# Patient Record
Sex: Female | Born: 1935 | Race: White | Hispanic: No | Marital: Married | Smoking: Former smoker
Health system: Southern US, Community
[De-identification: ages and names within clinical notes are randomized; demographics above are authoritative.]

## PROBLEM LIST (undated history)

## (undated) DIAGNOSIS — M1990 Unspecified osteoarthritis, unspecified site: Secondary | ICD-10-CM

## (undated) DIAGNOSIS — K219 Gastro-esophageal reflux disease without esophagitis: Secondary | ICD-10-CM

## (undated) DIAGNOSIS — I10 Essential (primary) hypertension: Secondary | ICD-10-CM

## (undated) DIAGNOSIS — R51 Headache: Secondary | ICD-10-CM

## (undated) DIAGNOSIS — Z974 Presence of external hearing-aid: Secondary | ICD-10-CM

## (undated) DIAGNOSIS — C801 Malignant (primary) neoplasm, unspecified: Secondary | ICD-10-CM

## (undated) DIAGNOSIS — R112 Nausea with vomiting, unspecified: Secondary | ICD-10-CM

## (undated) DIAGNOSIS — IMO0001 Reserved for inherently not codable concepts without codable children: Secondary | ICD-10-CM

## (undated) DIAGNOSIS — Z9889 Other specified postprocedural states: Secondary | ICD-10-CM

## (undated) HISTORY — PX: ABDOMINAL HYSTERECTOMY: SHX81

## (undated) HISTORY — PX: BREAST EXCISIONAL BIOPSY: SUR124

## (undated) HISTORY — PX: TONSILLECTOMY: SUR1361

## (undated) HISTORY — PX: BREAST SURGERY: SHX581

## (undated) HISTORY — PX: BACK SURGERY: SHX140

## (undated) HISTORY — PX: APPENDECTOMY: SHX54

---

## 2000-07-15 ENCOUNTER — Encounter: Admission: RE | Admit: 2000-07-15 | Discharge: 2000-07-15 | Payer: Self-pay | Admitting: Neurological Surgery

## 2000-07-15 ENCOUNTER — Encounter: Payer: Self-pay | Admitting: Neurological Surgery

## 2003-01-09 ENCOUNTER — Ambulatory Visit (HOSPITAL_COMMUNITY): Admission: RE | Admit: 2003-01-09 | Discharge: 2003-01-09 | Payer: Self-pay | Admitting: Neurology

## 2003-03-08 ENCOUNTER — Encounter: Admission: RE | Admit: 2003-03-08 | Discharge: 2003-03-08 | Payer: Self-pay | Admitting: Neurology

## 2003-03-29 ENCOUNTER — Ambulatory Visit (HOSPITAL_COMMUNITY): Admission: RE | Admit: 2003-03-29 | Discharge: 2003-03-29 | Payer: Self-pay | Admitting: Neurology

## 2003-06-16 ENCOUNTER — Emergency Department (HOSPITAL_COMMUNITY): Admission: EM | Admit: 2003-06-16 | Discharge: 2003-06-16 | Payer: Self-pay | Admitting: Physical Therapy

## 2003-09-25 ENCOUNTER — Ambulatory Visit (HOSPITAL_COMMUNITY): Admission: RE | Admit: 2003-09-25 | Discharge: 2003-09-25 | Payer: Self-pay | Admitting: Neurological Surgery

## 2003-10-25 ENCOUNTER — Ambulatory Visit (HOSPITAL_COMMUNITY): Admission: RE | Admit: 2003-10-25 | Discharge: 2003-10-25 | Payer: Self-pay | Admitting: Neurological Surgery

## 2003-12-04 ENCOUNTER — Encounter: Admission: RE | Admit: 2003-12-04 | Discharge: 2003-12-04 | Payer: Self-pay | Admitting: Neurological Surgery

## 2004-07-02 ENCOUNTER — Emergency Department (HOSPITAL_COMMUNITY): Admission: EM | Admit: 2004-07-02 | Discharge: 2004-07-02 | Payer: Self-pay | Admitting: Emergency Medicine

## 2004-09-18 ENCOUNTER — Ambulatory Visit (HOSPITAL_COMMUNITY): Admission: RE | Admit: 2004-09-18 | Discharge: 2004-09-18 | Payer: Self-pay | Admitting: Neurological Surgery

## 2005-09-28 ENCOUNTER — Ambulatory Visit (HOSPITAL_COMMUNITY): Admission: RE | Admit: 2005-09-28 | Discharge: 2005-09-28 | Payer: Self-pay | Admitting: Neurological Surgery

## 2005-11-19 ENCOUNTER — Encounter: Admission: RE | Admit: 2005-11-19 | Discharge: 2005-11-19 | Payer: Self-pay | Admitting: Neurology

## 2007-06-27 ENCOUNTER — Ambulatory Visit (HOSPITAL_COMMUNITY): Admission: RE | Admit: 2007-06-27 | Discharge: 2007-06-27 | Payer: Self-pay | Admitting: Neurological Surgery

## 2007-09-19 ENCOUNTER — Ambulatory Visit (HOSPITAL_COMMUNITY): Admission: RE | Admit: 2007-09-19 | Discharge: 2007-09-19 | Payer: Self-pay | Admitting: Neurological Surgery

## 2008-01-25 ENCOUNTER — Ambulatory Visit: Payer: Self-pay | Admitting: Internal Medicine

## 2009-05-23 ENCOUNTER — Ambulatory Visit: Payer: Self-pay | Admitting: Internal Medicine

## 2010-02-28 ENCOUNTER — Other Ambulatory Visit (HOSPITAL_COMMUNITY): Payer: Self-pay | Admitting: Neurological Surgery

## 2010-02-28 DIAGNOSIS — M419 Scoliosis, unspecified: Secondary | ICD-10-CM

## 2010-03-01 ENCOUNTER — Encounter: Payer: Self-pay | Admitting: Neurology

## 2010-03-01 ENCOUNTER — Encounter: Payer: Self-pay | Admitting: Orthopedic Surgery

## 2010-03-02 ENCOUNTER — Encounter: Payer: Self-pay | Admitting: Interventional Radiology

## 2010-04-29 ENCOUNTER — Ambulatory Visit (HOSPITAL_COMMUNITY)
Admission: RE | Admit: 2010-04-29 | Discharge: 2010-04-29 | Disposition: A | Discharge status: Home / Self Care | Payer: Medicare Other | Source: Ambulatory Visit | Attending: Neurological Surgery | Admitting: Neurological Surgery

## 2010-04-29 DIAGNOSIS — M419 Scoliosis, unspecified: Secondary | ICD-10-CM

## 2010-04-29 DIAGNOSIS — M545 Low back pain, unspecified: Secondary | ICD-10-CM | POA: Insufficient documentation

## 2010-04-29 DIAGNOSIS — M4120 Other idiopathic scoliosis, site unspecified: Secondary | ICD-10-CM | POA: Insufficient documentation

## 2010-04-29 DIAGNOSIS — Q762 Congenital spondylolisthesis: Secondary | ICD-10-CM | POA: Insufficient documentation

## 2010-04-29 DIAGNOSIS — M48061 Spinal stenosis, lumbar region without neurogenic claudication: Secondary | ICD-10-CM | POA: Insufficient documentation

## 2010-04-29 MED ORDER — IOHEXOL 180 MG/ML  SOLN
20.0000 mL | Freq: Once | INTRAMUSCULAR | Status: AC | PRN
  Administered 2010-04-29: 20 mL via INTRATHECAL

## 2010-06-03 ENCOUNTER — Other Ambulatory Visit (HOSPITAL_COMMUNITY): Payer: Self-pay

## 2010-06-20 ENCOUNTER — Encounter (HOSPITAL_COMMUNITY)
Admission: RE | Admit: 2010-06-20 | Discharge: 2010-06-20 | Disposition: A | Discharge status: Home / Self Care | Payer: Medicare Other | Source: Ambulatory Visit | Attending: Neurological Surgery | Admitting: Neurological Surgery

## 2010-06-20 ENCOUNTER — Other Ambulatory Visit (HOSPITAL_COMMUNITY): Payer: Self-pay | Admitting: Neurological Surgery

## 2010-06-20 ENCOUNTER — Other Ambulatory Visit (HOSPITAL_COMMUNITY): Payer: Medicare Other

## 2010-06-20 DIAGNOSIS — M47817 Spondylosis without myelopathy or radiculopathy, lumbosacral region: Secondary | ICD-10-CM

## 2010-06-20 LAB — BASIC METABOLIC PANEL
Chloride: 104 mEq/L (ref 96–112)
Creatinine, Ser: 0.73 mg/dL (ref 0.4–1.2)
GFR calc non Af Amer: >60 mL/min (ref >60)
Glucose, Bld: 103 mg/dL — ABNORMAL HIGH (ref 70–99)
Potassium: 3.9 mEq/L (ref 3.5–5.1)
Sodium: 141 mEq/L (ref 135–145)

## 2010-06-20 LAB — ABO/RH: ABO/RH(D): A POS

## 2010-06-20 LAB — CBC
HCT: 39.8 % (ref 36.0–46.0)
Hemoglobin: 13.8 g/dL (ref 12.0–15.0)
MCH: 30.6 pg (ref 26.0–34.0)
MCV: 88.2 fL (ref 78.0–100.0)
Platelets: 164 10*3/uL (ref 150–400)
RBC: 4.51 MIL/uL (ref 3.87–5.11)
RDW: 13.3 % (ref 11.5–15.5)
WBC: 5.5 10*3/uL (ref 4.0–10.5)

## 2010-06-20 LAB — SURGICAL PCR SCREEN
MRSA, PCR: NEGATIVE
Staphylococcus aureus: NEGATIVE

## 2010-06-23 ENCOUNTER — Inpatient Hospital Stay (HOSPITAL_COMMUNITY): Payer: Medicare Other

## 2010-06-23 ENCOUNTER — Inpatient Hospital Stay (HOSPITAL_COMMUNITY)
Admission: RE | Admit: 2010-06-23 | Discharge: 2010-07-01 | DRG: 457 | Disposition: A | Discharge status: Home Health | Payer: Medicare Other | Source: Ambulatory Visit | Attending: Neurological Surgery | Admitting: Neurological Surgery

## 2010-06-23 DIAGNOSIS — Z01818 Encounter for other preprocedural examination: Secondary | ICD-10-CM

## 2010-06-23 DIAGNOSIS — D62 Acute posthemorrhagic anemia: Secondary | ICD-10-CM | POA: Diagnosis not present

## 2010-06-23 DIAGNOSIS — M47817 Spondylosis without myelopathy or radiculopathy, lumbosacral region: Secondary | ICD-10-CM | POA: Diagnosis present

## 2010-06-23 DIAGNOSIS — T84498A Other mechanical complication of other internal orthopedic devices, implants and grafts, initial encounter: Secondary | ICD-10-CM | POA: Diagnosis present

## 2010-06-23 DIAGNOSIS — I10 Essential (primary) hypertension: Secondary | ICD-10-CM | POA: Diagnosis present

## 2010-06-23 DIAGNOSIS — Z79899 Other long term (current) drug therapy: Secondary | ICD-10-CM

## 2010-06-23 DIAGNOSIS — Y831 Surgical operation with implant of artificial internal device as the cause of abnormal reaction of the patient, or of later complication, without mention of misadventure at the time of the procedure: Secondary | ICD-10-CM | POA: Diagnosis present

## 2010-06-23 DIAGNOSIS — J449 Chronic obstructive pulmonary disease, unspecified: Secondary | ICD-10-CM | POA: Diagnosis present

## 2010-06-23 DIAGNOSIS — Z01810 Encounter for preprocedural cardiovascular examination: Secondary | ICD-10-CM

## 2010-06-23 DIAGNOSIS — Z01812 Encounter for preprocedural laboratory examination: Secondary | ICD-10-CM

## 2010-06-23 DIAGNOSIS — M4120 Other idiopathic scoliosis, site unspecified: Principal | ICD-10-CM | POA: Diagnosis present

## 2010-06-23 DIAGNOSIS — Q762 Congenital spondylolisthesis: Secondary | ICD-10-CM | POA: Diagnosis present

## 2010-06-23 DIAGNOSIS — J4489 Other specified chronic obstructive pulmonary disease: Secondary | ICD-10-CM | POA: Diagnosis present

## 2010-06-23 DIAGNOSIS — K219 Gastro-esophageal reflux disease without esophagitis: Secondary | ICD-10-CM | POA: Diagnosis present

## 2010-06-23 LAB — CBC
HCT: 31.1 % — ABNORMAL LOW (ref 36.0–46.0)
Hemoglobin: 10.5 g/dL — ABNORMAL LOW (ref 12.0–15.0)
MCH: 30 pg (ref 26.0–34.0)
MCHC: 33.8 g/dL (ref 30.0–36.0)
Platelets: 82 10*3/uL — ABNORMAL LOW (ref 150–400)
RBC: 3.5 MIL/uL — ABNORMAL LOW (ref 3.87–5.11)
RDW: 14.1 % (ref 11.5–15.5)
WBC: 9.4 10*3/uL (ref 4.0–10.5)

## 2010-06-24 LAB — TYPE AND SCREEN
ABO/RH(D): A POS
Antibody Screen: NEGATIVE
Unit division: 0

## 2010-06-24 LAB — POCT I-STAT 4, (NA,K, GLUC, HGB,HCT)
Glucose, Bld: 90 mg/dL (ref 70–99)
Potassium: 4 mEq/L (ref 3.5–5.1)

## 2010-06-24 LAB — CBC
MCHC: 33.9 g/dL (ref 30.0–36.0)
Platelets: 81 10*3/uL — ABNORMAL LOW (ref 150–400)
RDW: 14.2 % (ref 11.5–15.5)
WBC: 7.2 10*3/uL (ref 4.0–10.5)

## 2010-06-24 NOTE — Op Note (Signed)
NAMEMARIANE, [name]                 ACCOUNT NO.:  [id number]   MEDICAL RECORD NO.:  [id number]          PATIENT TYPE:  AMB   LOCATION:  SDS                          FACILITY:  [facility]   PHYSICIAN:  Stefani Dama, M.D.  DATE OF BIRTH:  28-Sep-1933   DATE OF PROCEDURE:  06/27/2007  DATE OF DISCHARGE:                               OPERATIVE REPORT   PREOPERATIVE DIAGNOSIS:  Right carpal tunnel syndrome.   POSTOPERATIVE DIAGNOSIS:  Right carpal tunnel syndrome.   PROCEDURE:  Right transcarpal release.   SURGEON:  Stefani Dama, MD.   ANESTHESIA:  MAC 1%, local, 4 mL.   ESTIMATED BLOOD LOSS:  Nil.   INDICATIONS:  Kathryn Gay is a 77 year old individual who 3 years ago  underwent left carpal tunnel release.  Her left upper extremity has done  well, but she has had progressive symptoms in her right hand and now has  atrophy in the thenar eminence.  She had been advised regarding the need  for carpal tunnel release and she was brought to the operating room and  placed on table in supine position.  After smooth induction of some  monitored anesthesia sedation at the upper extremity, which was prepped  with DuraPrep and draped in a stockinette, had a stockinette opened over  the region of the right wrist.  A line of incision was created from the  wrist crease in line pointing to the fourth ray and this was opened for  a distance of 2.5 cm after infiltrating with 1% lidocaine with  epinephrine.  The subcutaneous fat was divided and then transcarpal  ligament was divided.  The underlying nerve was noted be blanched.  Care  was taken to make sure that the distal portion of the transcarpal  ligament was divided to the fat pad and the proximal portion was  similarly divided by palpating under the wrist crease.  When the median  nerve was noted be fully divided, hemostasis was checked in the wound  and the skin was closed with interrupted vertical mattress of 3-0 nylon.  Dry dressing was then  applied to the right wrist with an Ace wrap.  The  patient tolerated the procedure well and was returned to the recovery  room in stable condition.      Stefani Dama, M.D.  Electronically Signed     HJE/MEDQ  D:  06/27/2007  T:  06/27/2007  Job:  [id number]

## 2010-06-24 NOTE — Op Note (Signed)
[name], [name]                 ACCOUNT NO.:  [id number]   MEDICAL RECORD NO.:  [id number]          PATIENT TYPE:  AMB   LOCATION:  SDS                          FACILITY:  [facility]   PHYSICIAN:  Stefani Dama, M.D.  DATE OF BIRTH:  [birth year]   DATE OF PROCEDURE:  06/27/2007  DATE OF DISCHARGE:                               OPERATIVE REPORT   PREOPERATIVE DIAGNOSIS:  Right carpal tunnel syndrome.   POSTOPERATIVE DIAGNOSIS:  Right carpal tunnel syndrome.   PROCEDURES:  Right carpal tunnel release.   SURGEON:  Stefani Dama, MD   FIRST ASSISTANT:  None.   ANESTHESIA:  MAC with 1% local lidocaine with epinephrine 4 mL.   INDICATIONS:  Kathryn Gay is a 77 year old individual who has had  significant bilateral carpal tunnel syndrome.  Three years ago, she  underwent release of her left carpal tunnel and this proved to be very  helpful.  She has had progressive pain in the right upper extremity and  now she developed a thenar eminence atrophy, and I advised that she  should undergo the right carpal tunnel release.   PROCEDURE:  The patient was brought to the operating room supine on a  stretcher after smooth induction of some sedation monitored by  anesthesia.  The upper extremity was prepped with DuraPrep and draped  out sterilely.  Stocking that was opened over the right wrist and the  area of the incision was demarcated from the wrist crease in an  direction pointing down the fourth ray.  Skin was infiltrated with 4 mL  of lidocaine without epinephrine and then a linear incision was made for  approximately 2.5 cm.  The dissection was taken through the subcutaneous  fat and a transcarpal ligament was then gradually divided.  It was noted  to be very thickened and with the carpal tunnel being divided.  Ultimately, the nerve could be identified.  The distal fat pad was  identified and then the proximal opening of the transcarpal ligament was  performed.  The ligament was opened  towards the ulnar aspect of the  incision and the median nerve was identified.  It was noted to be very  blanched and under some pressure such that once the final portions of  the ligament was opened.  It did recover some pinkish color.  The  proximal most portion of the ligament was sectioned.  The recurrent  branch was not identified during this dissection.  Hemostasis was  checked with some bipolar cautery and then the skin was closed with  vertical mattress sutures interrupted of 3-0 nylon.  The patient  tolerated the procedure well and then I had a dry dressing placed over  the wound and she was returned to recovery room in stable condition.      Stefani Dama, M.D.  Electronically Signed    HJE/MEDQ  D:  06/27/2007  T:  06/28/2007  Job:  [id number]

## 2010-06-27 NOTE — Op Note (Signed)
NAMERYLIEE, FIGGE NO.:  [id number]   MEDICAL RECORD NO.:  [id number]          PATIENT TYPE:  OIB   LOCATION:  2874                         FACILITY:  [facility]   PHYSICIAN:  Stefani Dama, M.D.  DATE OF BIRTH:  April 06, 1933   DATE OF PROCEDURE:  09/18/2004  DATE OF DISCHARGE:  09/18/2004                                 OPERATIVE REPORT   PREOPERATIVE DIAGNOSIS:  Left carpal tunnel syndrome.   POSTOPERATIVE DIAGNOSIS:  Left carpal tunnel syndrome.   PROCEDURE:  Left carpal tunnel release.   SURGEON:  Stefani Dama, M.D.   ANESTHESIA:  General endotracheal.   INDICATIONS:  Kathryn Gay is a 77 year old individual who has had  significant left carpal tunnel syndrome that she has been living with for a  number of years.  She has been advised regarding carpal tunnel release and  after careful consideration, is now taken to the operating room.   PROCEDURE:  The patient was brought to the operating room supine on the  stretcher.  After the smooth induction of some IV sedation, the left hand  was prepped with DuraPrep and then draped with a stockinette.  The  stockinette was opened over the region of the left wrist and the left wrist  crease was marked with a pen, as was a line from the midportion of the  crease down the line with fourth ray.  The area was then infiltrated with 1%  lidocaine with epinephrine 1:100,000 mixed 50/50 with 0.5% Marcaine for  total of 3 mL.  The marked line was then incised and the dissection was  taken through the superficial subcutaneous tissues down to the area of the  transcarpal ligament.  The ligament was incised until the underlying  structures were identified and this was noted be the nerve, which was matted  and blanched.  The ligament was then opened distally over a small hockey-  stick dissector and then opened proximally over the same hockey-stick  dissector until passed under the region of the wrist crease.  Care was taken  to make sure the nerve was identified fully.  The recurrent branch was not  identified during this procedure.  Once the dissection was completed, some  superficial tissue was dissected over the nerve.  Hemostasis in the soft  tissues was obtained with the bipolar cautery and then the skin over this  area was closed with 4-0 nylon in a vertical mattress suture.  The DuraPrep  was cleaned from around the periphery of the wound and then a dry sterile  dressing was applied with an Ace wrap.  The patient tolerated the procedure  well and was returned to recovery room in stable condition.      Stefani Dama, M.D.  Electronically Signed     HJE/MEDQ  D:  09/18/2004  T:  09/19/2004  Job:  [id number]

## 2010-06-28 LAB — CBC
HCT: 21.4 % — ABNORMAL LOW (ref 36.0–46.0)
MCH: 30.7 pg (ref 26.0–34.0)
MCV: 89.9 fL (ref 78.0–100.0)
Platelets: 114 10*3/uL — ABNORMAL LOW (ref 150–400)
RBC: 2.38 MIL/uL — ABNORMAL LOW (ref 3.87–5.11)
RDW: 14.3 % (ref 11.5–15.5)

## 2010-06-29 LAB — CROSSMATCH: Unit division: 0

## 2010-06-29 LAB — CBC
HCT: 29.4 % — ABNORMAL LOW (ref 36.0–46.0)
Hemoglobin: 10 g/dL — ABNORMAL LOW (ref 12.0–15.0)
MCHC: 34 g/dL (ref 30.0–36.0)
RBC: 3.37 MIL/uL — ABNORMAL LOW (ref 3.87–5.11)

## 2010-06-30 LAB — BASIC METABOLIC PANEL
BUN: 12 mg/dL (ref 6–23)
CO2: 29 mEq/L (ref 19–32)
Chloride: 101 mEq/L (ref 96–112)
Creatinine, Ser: 0.7 mg/dL (ref 0.4–1.2)
Glucose, Bld: 101 mg/dL — ABNORMAL HIGH (ref 70–99)
Potassium: 3.9 mEq/L (ref 3.5–5.1)

## 2010-06-30 LAB — CBC
HCT: 30.4 % — ABNORMAL LOW (ref 36.0–46.0)
Hemoglobin: 10.3 g/dL — ABNORMAL LOW (ref 12.0–15.0)
MCH: 29.7 pg (ref 26.0–34.0)
MCHC: 33.9 g/dL (ref 30.0–36.0)
MCV: 87.6 fL (ref 78.0–100.0)
Platelets: 181 10*3/uL (ref 150–400)
RBC: 3.47 MIL/uL — ABNORMAL LOW (ref 3.87–5.11)
RDW: 14.7 % (ref 11.5–15.5)
WBC: 4.4 10*3/uL (ref 4.0–10.5)

## 2010-07-01 LAB — POCT OCCULT BLOOD STOOL (DEVICE): Fecal Occult Bld: NEGATIVE

## 2010-07-01 LAB — BASIC METABOLIC PANEL
BUN: 13 mg/dL (ref 6–23)
CO2: 31 mEq/L (ref 19–32)
Calcium: 9.2 mg/dL (ref 8.4–10.5)
GFR calc non Af Amer: >60 mL/min (ref >60)
Glucose, Bld: 98 mg/dL (ref 70–99)
Sodium: 140 mEq/L (ref 135–145)

## 2010-07-01 LAB — CBC
HCT: 30.3 % — ABNORMAL LOW (ref 36.0–46.0)
Hemoglobin: 10.2 g/dL — ABNORMAL LOW (ref 12.0–15.0)
MCHC: 33.7 g/dL (ref 30.0–36.0)
MCV: 89.4 fL (ref 78.0–100.0)
RDW: 14.6 % (ref 11.5–15.5)

## 2010-08-14 NOTE — Op Note (Signed)
Kathryn Gay               ACCOUNT NO.:  [id number]  MEDICAL RECORD NO.:  [id number]           PATIENT TYPE:  I  LOCATION:  3103                         FACILITY:  [facility]  PHYSICIAN:  Stefani Dama, M.D.  DATE OF BIRTH:  September 16, 1933  DATE OF PROCEDURE:  06/23/2010 DATE OF DISCHARGE:                              OPERATIVE REPORT   PREOPERATIVE DIAGNOSES:  Spondylolisthesis L3-L4, spondylosis L2-L3 with severe stenosis, scoliosis, and lumbar radiculopathy.  POSTOPERATIVE DIAGNOSES:  Spondylolisthesis L3-L4, spondylosis L2-L3 with severe stenosis, scoliosis, and lumbar radiculopathy.  PROCEDURE:  L3 and L4 laminectomy, decompression of L2, L3, and L4 nerve roots with procedure greater than required for simple interbody grafting, posterior interbody arthrodesis using PEEK spacers, local autograft, and allograft, segmental fixation L2-L4 with pedicle screws, posterolateral arthrodesis with local autograft and allograft, removal of previously placed hardware from L4 to the sacrum.  SURGEON:  Stefani Dama, MD  FIRST ASSISTANT:  Hilda Lias, MD  ANESTHESIA:  General endotracheal.  INDICATIONS:  Kathryn Gay is a 77 year old individual who has had significant quite severe spondylitic stenosis with spondylolisthesis. She has evidence of lumbar stenosis that is severe at L3-4 as above and L4 to sacrum fusion.  She is advised regarding the need for surgical decompression, and she was developing a scoliosis in this region also.  PROCEDURE IN DETAIL:  The patient was brought to the operating room supine on the stretcher.  After smooth induction of general endotracheal anesthesia, she was turned prone.  The back was prepped with alcohol and DuraPrep and draped in sterile fashion.  Midline incision was created and carried down to the lumbodorsal fascia.  In lower part of incision, the fascia was opened through a dense scar tissue, and this was carried out to the lateral  gutters and the lateral gutters was identified the pedicle screws and hardware from the L4 to the sacrum.  These were Steffi plates that were placed in 1994 and after skeletonizing the hardware, we were able to remove each of the pedicle screws sequentially and then removed the plate.  The left-sided S1 screw was cracked and the stud was removed after drilling down to the stud going circumferentially around the outside of the stud and using a screw extractor to extract that stud.  The right side of hardware was removed without incident. The wound was then explored further and laminectomy of L4 was completed as was the laminectomy of L3 to decompress the outgoing foramen of the L2, the L3, and the L4 nerve roots in sequence.  Once these were all decompressed and we could mobilize the common dural tube, disk space could be entered first at L2-3.  The disk was noted to be severely degenerating and desiccated, and there was scoliosis at this region with the right side being closed down further than the left.  By using a series of disk space retractors, it was felt that by placing a singular spacer on the right side, we would distract this to the 12 mm size and then the both sides and interspace could be packed with autologous bone. This was in fact performed with 12  mm spacer and in the spacer was placed Vitoss mixed with PureGen, but in the interspace, we placed autograft from the laminectomy that was performed then by dissecting inferiorly the L3-4 disk space was entered.  The disk was uniformly degenerated with a very prominent grade 1 spondylolisthesis.  The disk space was opened and distracted again to a 12 mm size and here two 12-mm cages were ultimately packed and placed into the interspace to allow for good distraction and good decompression at L3 nerve roots laterally. Once the nerve roots were isolated and the interspace was then packed, attention was turned to placing pedicle screws  in L2, L3, and L4.  These were done under fluoroscopic guidance at L2 and L3 6.5 x 45 mm screws could be placed and placement could be checked and verified radiographically as well as visually with inspection of the cath to the exiting nerve root.  At L4, the previously used pedicle holes were used for the screws and on the right side, it was felt that a 7.5 mm screw would be required as the screw was actually rounded out in the bone itself.  A 6.5 x 45 mm screw was placed on the right side and the other screw was 6.5 x 45 mm screw on the left side.  A 60 and a 70 mm rods were then used to connect the screw heads together with a neutral construct.  Prior to doing this, the lateral gutters had been decorticated and were packed away.  Remainder of the allograft and autograft was packed into these two lateral gutters.  The wound was checked for hemostasis.  Each of the nerve roots L2, L3, and L4 were checked to make sure that they are well decompressed and no debris resides up around them.  When this was verified, the wound was irrigated copiously with antibiotic irrigating solution and the lumbodorsal fascia was closed with #1 Vicryl in interrupted fashion, 2-0 Vicryl was used in the subcutaneous and subcuticular tissues, 3-0 Vicryl subcuticularly for the final closure.  Blood loss for the procedure was noted to 1400 mL and 500 mL of Cell Saver blood was returned to the patient along with 1 unit of packed cells.     Stefani Dama, M.D.     [name]  D:  06/23/2010  T:  06/24/2010  Job:  [id number]  Electronically Signed by Barnett Abu M.D. on 08/14/2010 05:03:33 PM

## 2010-08-20 ENCOUNTER — Other Ambulatory Visit: Payer: Self-pay | Admitting: Neurological Surgery

## 2010-08-20 DIAGNOSIS — M419 Scoliosis, unspecified: Secondary | ICD-10-CM

## 2010-08-20 DIAGNOSIS — M4310 Spondylolisthesis, site unspecified: Secondary | ICD-10-CM

## 2010-08-21 ENCOUNTER — Other Ambulatory Visit: Payer: Medicare Other

## 2010-08-21 ENCOUNTER — Ambulatory Visit
Admission: RE | Admit: 2010-08-21 | Discharge: 2010-08-21 | Disposition: A | Discharge status: Home / Self Care | Payer: Medicare Other | Source: Ambulatory Visit | Attending: Neurological Surgery | Admitting: Neurological Surgery

## 2010-08-21 DIAGNOSIS — M419 Scoliosis, unspecified: Secondary | ICD-10-CM

## 2010-08-21 DIAGNOSIS — M4310 Spondylolisthesis, site unspecified: Secondary | ICD-10-CM

## 2010-09-15 NOTE — Discharge Summary (Signed)
  [name], [name]               ACCOUNT NO.:  [id number]  MEDICAL RECORD NO.:  [id number]  LOCATION:  3038                         FACILITY:  [facility]  PHYSICIAN:  Stefani Dama, M.D.  DATE OF BIRTH:  Nov 23, 1933  DATE OF ADMISSION:  06/23/2010 DATE OF DISCHARGE:  07/01/2010                              DISCHARGE SUMMARY   ADMITTING DIAGNOSES:  Spondylolisthesis L3-4, spondylosis L2-3, severe stenosis, scoliosis, and lumbar radiculopathy.  DISCHARGE DIAGNOSES:  Spondylolisthesis L3-4, spondylosis L2-3, severe stenosis, scoliosis, and lumbar radiculopathy.  OPERATIONS AND PROCEDURES:  L3-4 laminectomy, decompression L2, L3, L4 nerve roots, posterior interbody arthrodesis with segmental pedicle screw fixation L2-L4 with posterolateral arthrodesis, removal of previous hardware L4 to sacrum.  BRIEF HISTORY AND HOSPITAL COURSE:  Kathryn Gay is a 77 year old individual with significant spondylitic stenosis, spondylolisthesis recurrent.  She has evidence of lumbar stenosis with severe at L3-4 adjacent segment to her L4 to the sacrum fusion.  She has been advised on surgical decompression and fusion.  She has developing scoliosis, degenerative type in this region.  It was felt that she would benefit from revision posterior spinal fusion and decompression L2-L4 and removal of the old hardware.  The patient tolerated this procedure well on Jun 23, 2010, placed in the Neurosurgical ICU.  First day postoperatively, the patient was doing well, hemoglobin was 9.4, hematocrit 27.7.  Wound was benign.  No erythema, drainage, or signs of action.  Foley catheter was discontinued, transferred to 3000, IV morphine for pain control, discontinued PCA pump, and p.o. Percocet. Second day postoperatively, the patient was transferred to 3000, started with physical therapy, occupational therapy the day before.  She was ready for discharge home on Jul 01, 2010, eating well, voiding well, ambulating  safely.  Wound benign, no erythema, no drainage, no signs of infection, no new neurological deficits.  Home health physical therapy and occupational therapy and home health aid were arranged.  Durable medical equipment was ordered as needed.  Given a prescription for Percocet and Vicodin to use for pain.  Discharged home.  Follow up in 3- 4 weeks.  Continue with an Aspen quick draw brace.  Follow her back precautions.  DISCHARGE CONDITION:  Stable, improved.  DISCHARGE MEDICATIONS:  Continue on her home medications of metoprolol, doxazosin.  She can resume her herbal and home medicines of multivitamins, zinc, magnesium oxide, vitamin D, vitamin E, calcium, fish oil, black cohosh.  She should hold ibuprofen for a month.  She is given Percocet and Vicodin for pain control.  Follow up in 3-4 weeks with Dr. Danielle Dess.  Contact our office prior to follow up with any question or concerns.     Aura Fey [facility].   ______________________________ Stefani Dama, M.D.    SCI/MEDQ  D:  08/28/2010  T:  08/28/2010  Job:  [id number]  Electronically Signed by Orlin Hilding P.A. on 09/02/2010 08:46:03 AM Electronically Signed by Barnett Abu M.D. on 09/15/2010 07:29:44 AM

## 2010-11-07 LAB — CREATININE, SERUM
Creatinine, Ser: 0.85
GFR calc non Af Amer: >60

## 2013-05-15 ENCOUNTER — Other Ambulatory Visit: Payer: Self-pay | Admitting: Internal Medicine

## 2013-05-15 DIAGNOSIS — M545 Low back pain, unspecified: Secondary | ICD-10-CM

## 2013-05-19 ENCOUNTER — Other Ambulatory Visit: Payer: Medicare Other

## 2013-05-20 ENCOUNTER — Ambulatory Visit
Admission: RE | Admit: 2013-05-20 | Discharge: 2013-05-20 | Disposition: A | Discharge status: Home / Self Care | Payer: Medicare Other | Source: Ambulatory Visit | Attending: Internal Medicine | Admitting: Internal Medicine

## 2013-05-20 DIAGNOSIS — M545 Low back pain, unspecified: Secondary | ICD-10-CM

## 2013-06-13 DIAGNOSIS — M1990 Unspecified osteoarthritis, unspecified site: Secondary | ICD-10-CM | POA: Insufficient documentation

## 2013-06-13 DIAGNOSIS — M519 Unspecified thoracic, thoracolumbar and lumbosacral intervertebral disc disorder: Secondary | ICD-10-CM | POA: Insufficient documentation

## 2013-06-19 ENCOUNTER — Other Ambulatory Visit: Payer: Self-pay | Admitting: Neurological Surgery

## 2013-06-19 ENCOUNTER — Encounter (HOSPITAL_COMMUNITY): Payer: Self-pay | Admitting: *Deleted

## 2013-06-20 ENCOUNTER — Inpatient Hospital Stay (HOSPITAL_COMMUNITY)
Admission: RE | Admit: 2013-06-20 | Discharge: 2013-06-21 | DRG: 517 | Disposition: A | Discharge status: Home / Self Care | Payer: Medicare Other | Source: Ambulatory Visit | Attending: Neurological Surgery | Admitting: Neurological Surgery

## 2013-06-20 ENCOUNTER — Inpatient Hospital Stay (HOSPITAL_COMMUNITY): Payer: Medicare Other

## 2013-06-20 ENCOUNTER — Encounter (HOSPITAL_COMMUNITY): Payer: Medicare Other | Admitting: Anesthesiology

## 2013-06-20 ENCOUNTER — Encounter (HOSPITAL_COMMUNITY)
Admission: RE | Disposition: A | Discharge status: Home / Self Care | Payer: Self-pay | Source: Ambulatory Visit | Attending: Neurological Surgery

## 2013-06-20 ENCOUNTER — Inpatient Hospital Stay (HOSPITAL_COMMUNITY): Payer: Medicare Other | Admitting: Anesthesiology

## 2013-06-20 ENCOUNTER — Encounter (HOSPITAL_COMMUNITY): Payer: Self-pay | Admitting: Pharmacy Technician

## 2013-06-20 ENCOUNTER — Encounter (HOSPITAL_COMMUNITY): Payer: Self-pay | Admitting: Anesthesiology

## 2013-06-20 DIAGNOSIS — I10 Essential (primary) hypertension: Secondary | ICD-10-CM | POA: Diagnosis present

## 2013-06-20 DIAGNOSIS — Z87891 Personal history of nicotine dependence: Secondary | ICD-10-CM

## 2013-06-20 DIAGNOSIS — M129 Arthropathy, unspecified: Secondary | ICD-10-CM | POA: Diagnosis not present

## 2013-06-20 DIAGNOSIS — M810 Age-related osteoporosis without current pathological fracture: Secondary | ICD-10-CM | POA: Diagnosis not present

## 2013-06-20 DIAGNOSIS — Z981 Arthrodesis status: Secondary | ICD-10-CM

## 2013-06-20 DIAGNOSIS — M8448XA Pathological fracture, other site, initial encounter for fracture: Secondary | ICD-10-CM | POA: Diagnosis not present

## 2013-06-20 DIAGNOSIS — Z9889 Other specified postprocedural states: Secondary | ICD-10-CM

## 2013-06-20 HISTORY — DX: Nausea with vomiting, unspecified: Z98.890

## 2013-06-20 HISTORY — DX: Essential (primary) hypertension: I10

## 2013-06-20 HISTORY — DX: Nausea with vomiting, unspecified: R11.2

## 2013-06-20 HISTORY — DX: Malignant (primary) neoplasm, unspecified: C80.1

## 2013-06-20 HISTORY — PX: KYPHOPLASTY: SHX5884

## 2013-06-20 HISTORY — DX: Headache: R51

## 2013-06-20 HISTORY — DX: Unspecified osteoarthritis, unspecified site: M19.90

## 2013-06-20 LAB — CBC WITH DIFFERENTIAL/PLATELET
BASOS ABS: 0 10*3/uL (ref 0.0–0.1)
BASOS PCT: 0 % (ref 0–1)
EOS ABS: 0.2 10*3/uL (ref 0.0–0.7)
Eosinophils Relative: 4 % (ref 0–5)
HCT: 38.5 % (ref 36.0–46.0)
Hemoglobin: 12.9 g/dL (ref 12.0–15.0)
LYMPHS ABS: 1.2 10*3/uL (ref 0.7–4.0)
Lymphocytes Relative: 25 % (ref 12–46)
MCH: 30.8 pg (ref 26.0–34.0)
MCHC: 33.5 g/dL (ref 30.0–36.0)
MCV: 91.9 fL (ref 78.0–100.0)
Monocytes Absolute: 0.4 10*3/uL (ref 0.1–1.0)
Monocytes Relative: 8 % (ref 3–12)
NEUTROS PCT: 63 % (ref 43–77)
Neutro Abs: 3 10*3/uL (ref 1.7–7.7)
PLATELETS: 148 10*3/uL — AB (ref 150–400)
RBC: 4.19 MIL/uL (ref 3.87–5.11)
RDW: 13.2 % (ref 11.5–15.5)
WBC: 4.8 10*3/uL (ref 4.0–10.5)

## 2013-06-20 LAB — BASIC METABOLIC PANEL
BUN: 23 mg/dL (ref 6–23)
CALCIUM: 9 mg/dL (ref 8.4–10.5)
CO2: 22 mEq/L (ref 19–32)
CREATININE: 0.81 mg/dL (ref 0.50–1.10)
Chloride: 105 mEq/L (ref 96–112)
GFR calc Af Amer: 79 mL/min — ABNORMAL LOW (ref >90)
GFR, EST NON AFRICAN AMERICAN: 68 mL/min — AB (ref >90)
GLUCOSE: 88 mg/dL (ref 70–99)
Potassium: 4.1 mEq/L (ref 3.7–5.3)
SODIUM: 140 meq/L (ref 137–147)

## 2013-06-20 LAB — SURGICAL PCR SCREEN
MRSA, PCR: NEGATIVE
STAPHYLOCOCCUS AUREUS: NEGATIVE

## 2013-06-20 SURGERY — KYPHOPLASTY
Anesthesia: General | Site: Back

## 2013-06-20 MED ORDER — MUPIROCIN 2 % EX OINT
TOPICAL_OINTMENT | CUTANEOUS | Status: AC
  Administered 2013-06-20: 1
  Filled 2013-06-20: qty 22

## 2013-06-20 MED ORDER — BUPIVACAINE HCL (PF) 0.25 % IJ SOLN
INTRAMUSCULAR | Status: DC | PRN
  Administered 2013-06-20: 2 mL

## 2013-06-20 MED ORDER — MORPHINE SULFATE 2 MG/ML IJ SOLN
1.0000 mg | INTRAMUSCULAR | Status: DC | PRN
  Administered 2013-06-20: 2 mg via INTRAVENOUS

## 2013-06-20 MED ORDER — PROMETHAZINE HCL 25 MG/ML IJ SOLN
INTRAMUSCULAR | Status: AC
  Filled 2013-06-20: qty 1

## 2013-06-20 MED ORDER — LIDOCAINE HCL (CARDIAC) 20 MG/ML IV SOLN
INTRAVENOUS | Status: AC
  Filled 2013-06-20: qty 5

## 2013-06-20 MED ORDER — CEFAZOLIN SODIUM-DEXTROSE 2-3 GM-% IV SOLR
INTRAVENOUS | Status: AC
  Filled 2013-06-20: qty 50

## 2013-06-20 MED ORDER — LIDOCAINE-EPINEPHRINE 1 %-1:100000 IJ SOLN
INTRAMUSCULAR | Status: DC | PRN
  Administered 2013-06-20: 2 mL

## 2013-06-20 MED ORDER — ONDANSETRON HCL 4 MG/2ML IJ SOLN
INTRAMUSCULAR | Status: AC
  Filled 2013-06-20: qty 2

## 2013-06-20 MED ORDER — ROCURONIUM BROMIDE 100 MG/10ML IV SOLN
INTRAVENOUS | Status: DC | PRN
  Administered 2013-06-20: 25 mg via INTRAVENOUS

## 2013-06-20 MED ORDER — SODIUM CHLORIDE 0.9 % IV SOLN: 250.0000 mL | INTRAVENOUS | Status: DC

## 2013-06-20 MED ORDER — MORPHINE SULFATE 2 MG/ML IJ SOLN
INTRAMUSCULAR | Status: AC
  Filled 2013-06-20: qty 1

## 2013-06-20 MED ORDER — KETOROLAC TROMETHAMINE 30 MG/ML IJ SOLN
INTRAMUSCULAR | Status: AC
  Administered 2013-06-20: 30 mg
  Filled 2013-06-20: qty 1

## 2013-06-20 MED ORDER — ACETAMINOPHEN 650 MG RE SUPP: 650.0000 mg | RECTAL | Status: DC | PRN

## 2013-06-20 MED ORDER — ROCURONIUM BROMIDE 50 MG/5ML IV SOLN
INTRAVENOUS | Status: AC
  Filled 2013-06-20: qty 1

## 2013-06-20 MED ORDER — FENTANYL CITRATE 0.05 MG/ML IJ SOLN
INTRAMUSCULAR | Status: AC
  Filled 2013-06-20: qty 5

## 2013-06-20 MED ORDER — LACTATED RINGERS IV SOLN: INTRAVENOUS | Status: DC

## 2013-06-20 MED ORDER — LACTATED RINGERS IV SOLN
INTRAVENOUS | Status: DC | PRN
  Administered 2013-06-20: 16:00:00 via INTRAVENOUS

## 2013-06-20 MED ORDER — NEOSTIGMINE METHYLSULFATE 10 MG/10ML IV SOLN
INTRAVENOUS | Status: DC | PRN
  Administered 2013-06-20 (×2): 2 mg via INTRAVENOUS

## 2013-06-20 MED ORDER — DEXAMETHASONE SODIUM PHOSPHATE 10 MG/ML IJ SOLN
INTRAMUSCULAR | Status: DC | PRN
  Administered 2013-06-20: 4 mg via INTRAVENOUS

## 2013-06-20 MED ORDER — ONDANSETRON HCL 4 MG/2ML IJ SOLN
4.0000 mg | INTRAMUSCULAR | Status: DC | PRN
  Administered 2013-06-20: 4 mg via INTRAVENOUS

## 2013-06-20 MED ORDER — GLYCOPYRROLATE 0.2 MG/ML IJ SOLN
INTRAMUSCULAR | Status: DC | PRN
  Administered 2013-06-20: .3 mg via INTRAVENOUS
  Administered 2013-06-20: 0.2 mg via INTRAVENOUS
  Administered 2013-06-20: .3 mg via INTRAVENOUS

## 2013-06-20 MED ORDER — PROPOFOL 10 MG/ML IV BOLUS
INTRAVENOUS | Status: DC | PRN
  Administered 2013-06-20: 150 mg via INTRAVENOUS

## 2013-06-20 MED ORDER — PROMETHAZINE HCL 25 MG/ML IJ SOLN
6.2500 mg | Freq: Four times a day (QID) | INTRAMUSCULAR | Status: DC | PRN
  Administered 2013-06-20: 6.25 mg via INTRAVENOUS

## 2013-06-20 MED ORDER — SODIUM CHLORIDE 0.9 % IJ SOLN: 3.0000 mL | Freq: Two times a day (BID) | INTRAMUSCULAR | Status: DC

## 2013-06-20 MED ORDER — EPHEDRINE SULFATE 50 MG/ML IJ SOLN
INTRAMUSCULAR | Status: DC | PRN
  Administered 2013-06-20: 15 mg via INTRAVENOUS

## 2013-06-20 MED ORDER — KETOROLAC TROMETHAMINE 15 MG/ML IJ SOLN
15.0000 mg | Freq: Four times a day (QID) | INTRAMUSCULAR | Status: DC
  Administered 2013-06-20 – 2013-06-21 (×2): 15 mg via INTRAVENOUS
  Filled 2013-06-20 (×4): qty 1

## 2013-06-20 MED ORDER — LIDOCAINE HCL (CARDIAC) 20 MG/ML IV SOLN
INTRAVENOUS | Status: DC | PRN
  Administered 2013-06-20: 100 mg via INTRAVENOUS

## 2013-06-20 MED ORDER — SODIUM CHLORIDE 0.9 % IJ SOLN: 3.0000 mL | INTRAMUSCULAR | Status: DC | PRN

## 2013-06-20 MED ORDER — ACETAMINOPHEN 325 MG PO TABS: 650.0000 mg | ORAL_TABLET | ORAL | Status: DC | PRN

## 2013-06-20 MED ORDER — DEXAMETHASONE SODIUM PHOSPHATE 4 MG/ML IJ SOLN
INTRAMUSCULAR | Status: AC
  Filled 2013-06-20: qty 1

## 2013-06-20 MED ORDER — FENTANYL CITRATE 0.05 MG/ML IJ SOLN
INTRAMUSCULAR | Status: DC | PRN
  Administered 2013-06-20: 100 ug via INTRAVENOUS

## 2013-06-20 MED ORDER — CEFAZOLIN SODIUM-DEXTROSE 2-3 GM-% IV SOLR
2.0000 g | INTRAVENOUS | Status: AC
  Administered 2013-06-20: 2 g via INTRAVENOUS

## 2013-06-20 MED ORDER — MENTHOL 3 MG MT LOZG: 1.0000 | LOZENGE | OROMUCOSAL | Status: DC | PRN

## 2013-06-20 MED ORDER — PHENOL 1.4 % MT LIQD: 1.0000 | OROMUCOSAL | Status: DC | PRN

## 2013-06-20 SURGICAL SUPPLY — 49 items
BANDAGE ADHESIVE 1X3 (GAUZE/BANDAGES/DRESSINGS) IMPLANT
BLADE 10 SAFETY STRL DISP (BLADE) IMPLANT
BLADE SURG 11 STRL SS (BLADE) ×3 IMPLANT
BLADE SURG ROTATE 9660 (MISCELLANEOUS) IMPLANT
CEMENT BONE KYPHX HV R (Orthopedic Implant) ×3 IMPLANT
CEMENT KYPHON C01A KIT/MIXER (Cement) ×3 IMPLANT
CONT SPEC 4OZ CLIKSEAL STRL BL (MISCELLANEOUS) ×3 IMPLANT
DECANTER SPIKE VIAL GLASS SM (MISCELLANEOUS) IMPLANT
DERMABOND ADVANCED (GAUZE/BANDAGES/DRESSINGS) ×2
DERMABOND ADVANCED .7 DNX12 (GAUZE/BANDAGES/DRESSINGS) ×1 IMPLANT
DRAPE C-ARM 42X72 X-RAY (DRAPES) ×3 IMPLANT
DRAPE INCISE IOBAN 66X45 STRL (DRAPES) ×3 IMPLANT
DRAPE LAPAROTOMY 100X72X124 (DRAPES) ×3 IMPLANT
DRAPE PROXIMA HALF (DRAPES) ×3 IMPLANT
DURAPREP 26ML APPLICATOR (WOUND CARE) ×3 IMPLANT
GAUZE SPONGE 4X4 16PLY XRAY LF (GAUZE/BANDAGES/DRESSINGS) ×3 IMPLANT
GLOVE BIO SURGEON STRL SZ7.5 (GLOVE) IMPLANT
GLOVE BIOGEL PI IND STRL 7.0 (GLOVE) ×1 IMPLANT
GLOVE BIOGEL PI IND STRL 7.5 (GLOVE) IMPLANT
GLOVE BIOGEL PI IND STRL 8.5 (GLOVE) ×1 IMPLANT
GLOVE BIOGEL PI INDICATOR 7.0 (GLOVE) ×2
GLOVE BIOGEL PI INDICATOR 7.5 (GLOVE)
GLOVE BIOGEL PI INDICATOR 8.5 (GLOVE) ×2
GLOVE ECLIPSE 8.5 STRL (GLOVE) ×3 IMPLANT
GLOVE EXAM NITRILE LRG STRL (GLOVE) IMPLANT
GLOVE EXAM NITRILE MD LF STRL (GLOVE) IMPLANT
GLOVE EXAM NITRILE XL STR (GLOVE) IMPLANT
GLOVE EXAM NITRILE XS STR PU (GLOVE) IMPLANT
GLOVE SURG SS PI 7.0 STRL IVOR (GLOVE) ×3 IMPLANT
GOWN BRE IMP SLV AUR LG STRL (GOWN DISPOSABLE) IMPLANT
GOWN BRE IMP SLV AUR XL STRL (GOWN DISPOSABLE) IMPLANT
GOWN STRL REIN 2XL LVL4 (GOWN DISPOSABLE) IMPLANT
GOWN STRL REUS W/ TWL LRG LVL3 (GOWN DISPOSABLE) ×1 IMPLANT
GOWN STRL REUS W/TWL 2XL LVL3 (GOWN DISPOSABLE) ×3 IMPLANT
GOWN STRL REUS W/TWL LRG LVL3 (GOWN DISPOSABLE) ×2
KIT BASIN OR (CUSTOM PROCEDURE TRAY) ×3 IMPLANT
KIT ROOM TURNOVER OR (KITS) ×3 IMPLANT
NEEDLE HYPO 25X1 1.5 SAFETY (NEEDLE) ×3 IMPLANT
NS IRRIG 1000ML POUR BTL (IV SOLUTION) ×3 IMPLANT
PACK SURGICAL SETUP 50X90 (CUSTOM PROCEDURE TRAY) ×3 IMPLANT
PAD ARMBOARD 7.5X6 YLW CONV (MISCELLANEOUS) ×9 IMPLANT
SPECIMEN JAR SMALL (MISCELLANEOUS) IMPLANT
SUT VIC AB 3-0 SH 8-18 (SUTURE) ×3 IMPLANT
SUT VIC AB 4-0 P-3 18X BRD (SUTURE) IMPLANT
SUT VIC AB 4-0 P3 18 (SUTURE)
SYR CONTROL 10ML LL (SYRINGE) ×6 IMPLANT
TOWEL OR 17X24 6PK STRL BLUE (TOWEL DISPOSABLE) ×3 IMPLANT
TOWEL OR 17X26 10 PK STRL BLUE (TOWEL DISPOSABLE) ×3 IMPLANT
TRAY KYPHOPAK 20/3 ONESTEP 1ST (MISCELLANEOUS) ×3 IMPLANT

## 2013-06-20 NOTE — Anesthesia Preprocedure Evaluation (Addendum)
Anesthesia Evaluation  Patient identified by MRN, date of birth, ID band Patient awake    Reviewed: Allergy & Precautions, H&P , NPO status , Patient's Chart, lab work & pertinent test results, reviewed documented beta blocker date and time   History of Anesthesia Complications (+) PONV and history of anesthetic complications  Airway Mallampati: II TM Distance: >3 FB Neck ROM: full    Dental  (+) Dental Advisory Given,  Recent dental work with Left upper teeth pulled:   Pulmonary neg pulmonary ROS, former smoker,  breath sounds clear to auscultation        Cardiovascular hypertension, On Medications and On Home Beta Blockers negative cardio ROS  Rhythm:regular     Neuro/Psych  Headaches, negative neurological ROS  negative psych ROS   GI/Hepatic negative GI ROS, Neg liver ROS,   Endo/Other  negative endocrine ROS  Renal/GU negative Renal ROS  negative genitourinary   Musculoskeletal   Abdominal   Peds  Hematology negative hematology ROS (+)   Anesthesia Other Findings See surgeon's H&P   Reproductive/Obstetrics negative OB ROS                         Anesthesia Physical Anesthesia Plan  ASA: II  Anesthesia Plan: General   Post-op Pain Management:    Induction: Intravenous  Airway Management Planned: Oral ETT  Additional Equipment:   Intra-op Plan:   Post-operative Plan: Extubation in OR  Informed Consent: I have reviewed the patients History and Physical, chart, labs and discussed the procedure including the risks, benefits and alternatives for the proposed anesthesia with the patient or authorized representative who has indicated his/her understanding and acceptance.   Dental Advisory Given  Plan Discussed with: CRNA and Surgeon  Anesthesia Plan Comments:         Anesthesia Quick Evaluation

## 2013-06-20 NOTE — H&P (Signed)
Kathryn Gay is an 80 y.o. female.   Chief Complaint: Thoracolumbar back pain HPI: A drift and 80 year old individual who has had a fusion from L2 to the sacrum in the past. In March she had an MRI that demonstrates that she has some adjacent level disease at L1-L2 with some moderate stenosis the other day she acutely developed severe pain in the thoracic lumbar junction this was while she was trying to lift her grandchild through a park to [place] in which the grandchild was stuck. She felt a sudden excruciating pain in her mid lumbar and thoracolumbar spine. Came in yesterday for an x-ray of her spine and this demonstrates the presence of a T12 compression fracture with approximately 50% loss of height ventrally. She's been advised regarding acrylic balloon kyphoplasty and is now being brought for this procedure. She has been very uncomfortable in the interim since this occurred on this past Friday. She desires to go ahead with acrylic balloon kyphoplasty.  Past Medical History  Diagnosis Date  . PONV (postoperative nausea and vomiting)   . Hypertension   . Headache(784.0)   . Arthritis   . 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 Abdominal hysterectomy    . Back surgery      x 2  . Breast surgery      lumpectomies  . Tonsillectomy    . Appendectomy      History reviewed. No pertinent family history. Social History:  reports that she has quit smoking. Her smoking use included Cigarettes. She smoked 0.00 packs per day. She has never used smokeless tobacco. She reports that she does not drink alcohol. Her drug history is not on file.  Allergies: No Known Allergies  No prescriptions prior to admission    No results found for this or any previous visit (from the past 48 hour(s)). No results found.  Review of Systems  Constitutional: Negative.        Easy fatigability  HENT: Negative.   Eyes: Negative.   Respiratory:  Negative.   Cardiovascular: Negative.   Gastrointestinal: Negative.   Genitourinary: Negative.   Musculoskeletal:       Back pain at the thoracic lumbar spine. Bilateral leg pain chronic.  Skin: Negative.   Neurological:       Back pain and weakness with numbness in the both lower extremities  Psychiatric/Behavioral: Negative.     There were no vitals taken for this visit. Physical Exam  Constitutional: She appears well-developed and well-nourished.  HENT:  Head: Normocephalic and atraumatic.  Eyes: Conjunctivae and EOM are normal. Pupils are equal, round, and reactive to light.  Neck: Normal range of motion. Neck supple.  Cardiovascular: Normal rate and regular rhythm.   Respiratory: Effort normal.  GI: Soft. Bowel sounds are normal.  Musculoskeletal:  Back pain at thoracolumbar junction to palpation and percussion  Neurological:  Absent deep tendon reflexes in lower extremities. Trace reflexes in upper extremity. Cranial nerve examination is normal the  Skin: Skin is warm and dry.  Psychiatric: She has a normal mood and affect. Her behavior is normal. Judgment and thought content normal.     Assessment/Plan T12 compression fracture, acrylic balloon kyphoplasty.  Kristeen Miss 06/20/2013, 7:55 AM

## 2013-06-20 NOTE — Anesthesia Procedure Notes (Signed)
Procedure Name: Intubation Date/Time: 06/20/2013 5:38 PM Performed by: Raphael Gibney T Pre-anesthesia Checklist: Patient identified, Timeout performed, Emergency Drugs available, Suction available and Patient being monitored Patient Re-evaluated:Patient Re-evaluated prior to inductionOxygen Delivery Method: Circle system utilized and Simple face mask Preoxygenation: Pre-oxygenation with 100% oxygen Intubation Type: IV induction Ventilation: Mask ventilation without difficulty Laryngoscope Size: Miller and 2 Grade View: Grade I Tube type: Oral Tube size: 7.0 mm Number of attempts: 1 Airway Equipment and Method: Patient positioned with wedge pillow and Stylet Placement Confirmation: ETT inserted through vocal cords under direct vision,  positive ETCO2 and breath sounds checked- equal and bilateral Secured at: 21 cm Tube secured with: Tape Dental Injury: Teeth and Oropharynx as per pre-operative assessment

## 2013-06-20 NOTE — Transfer of Care (Signed)
Immediate Anesthesia Transfer of Care Note  Patient: Kathryn Gay  Procedure(s) Performed: Procedure(s) with comments: T12 KYPHOPLASTY (N/A) - Thoracic Twelve KYPHOPLASTY  Patient Location: PACU  Anesthesia Type:General  Level of Consciousness: awake and alert   Airway & Oxygen Therapy: Patient Spontanous Breathing and Patient connected to nasal cannula oxygen  Post-op Assessment: Report given to PACU RN, Post -op Vital signs reviewed and stable and Patient moving all extremities X 4  Post vital signs: Reviewed and stable  Complications: No apparent anesthesia complications

## 2013-06-20 NOTE — Op Note (Signed)
Date of surgery: 06/20/2013 Preoperative diagnosis: Pathologic fracture of T12 Post operative diagnosis: Pathologic fracture of T12 (osteoporosis Procedure: Acrylic balloon kyphoplasty T12 Surgeon: Mallie Mussel Tawfiq Favila,MD Indications: Patient is a 80 year old individual who's had significant problems with acute back pain starting little over week ago when she's trying to follow her to lift her grandchild. She felt a sudden pop in her back and explosive pain in her mid lumbar spine. She is found to have a T12 compression fracture. The pain has been unrelenting despite maximal medical meds medications.  Procedure: The patient was brought to the operating room supine on a stretcher. After the smooth induction of general endotracheal anesthesia the patient was carefully positioned in the prone position on the operating table with the bony prominences appropriately padded and protected. Overalls were used under the patient's chest back was prepped with alcohol and DuraPrep and draped in a sterile fashion and then biplane fluoroscopy was used to isolate the T12 vertebrae. A pedicle entry site for this her vertebrae was chosen 4 cm lateral to the pedicle at the 9:00 position. A Jamshidi needle was inserted into the pedicle via the transfer radicular transvertebral approach. Biplane fluoroscopy was used during this procedure. Then the cannula was in the vertebral body the inner cannula was removed and a bone drill was used to create a space for the balloon. Balloon was then inflated 150 mmHg and was noted to deteriorate to 90 millimeters of pressure. 4 Cc of dye was injected into the balloon. The med was mixed to appropriate consistency and then injected into the vertebral body under biplane fluoroscopy until complete filling was achieved. A total of 6 cc of cement was injected.  Final fluoroscopic images were obtained the Jamshidi needle was removed the incision was closed with a singular 3-0 Vicryl stitch and the patient  was then returned to the recovery room in stable condition.

## 2013-06-21 DIAGNOSIS — Z9889 Other specified postprocedural states: Secondary | ICD-10-CM

## 2013-06-21 NOTE — Progress Notes (Signed)
Pt. Alert and oriented,follows simple instructions, denies pain. Incision area without swelling, redness or S/S of infection. Voiding adequate clear yellow urine. Moving all extremities well and vitals stable and documented. Patient discharged home with family.  Lumbar surgery notes instructions given to patient and family member for home safety and precautions. Pt. and family stated understanding of instructions given 

## 2013-06-21 NOTE — Evaluation (Signed)
Occupational Therapy Evaluation Patient Details Name: Kathryn Gay MRN: [id number] DOB: [birth year] Today's Date: 06/21/2013    History of Present Illness s/p T 12 kyphoplasty due to pathologic fx (osteoporosis)   Clinical Impression   Pt is performing ADL and ADL transfers at a modified independent level.  She has been practicing back precautions prior to admission due to long standing back issues.  Reinforced precautions and instructed in safety.  No further OT needs.    Follow Up Recommendations  No OT follow up    Equipment Recommendations  None recommended by OT    Recommendations for Other Services       Precautions / Restrictions Precautions Precautions: Back Precaution Booklet Issued: Yes (comment) Precaution Comments: reviewed back precautions related to ADL/IADL Restrictions Weight Bearing Restrictions: No      Mobility Bed Mobility Overal bed mobility: Modified Independent             General bed mobility comments: uses log roll technique  Transfers Overall transfer level: Modified independent Equipment used: None                  Balance                                            ADL Overall ADL's : Modified independent                                       General ADL Comments: Reminded pt to bring foot up rather than bending over to donn socks.  Recommended use of long handled bath sponge. Instructed in getting in and out of car avoiding twisting.     Vision                     Perception     Praxis      Pertinent Vitals/Pain VSS, soreness in back, repositioned     Hand Dominance Right   Extremity/Trunk Assessment Upper Extremity Assessment Upper Extremity Assessment: Overall WFL for tasks assessed   Lower Extremity Assessment Lower Extremity Assessment: Overall WFL for tasks assessed       Communication Communication Communication: No difficulties   Cognition  Arousal/Alertness: Awake/alert Behavior During Therapy: WFL for tasks assessed/performed Overall Cognitive Status: Within Functional Limits for tasks assessed                     General Comments       Exercises       Shoulder Instructions      Home Living Family/patient expects to be discharged to:: Private residence Living Arrangements: Spouse/significant other Available Help at Discharge: Family;Available 24 hours/day Type of Home: Independent living facility Home Access: Level entry     Home Layout: One level     Bathroom Shower/Tub: Occupational psychologist: Handicapped height     Home Equipment: Shower seat - built in;Hand held shower head;Grab bars - toilet;Grab bars - tub/shower, reachers          Prior Functioning/Environment Level of Independence: Independent             OT Diagnosis:     OT Problem List:     OT Treatment/Interventions:      OT Goals(Current goals can be found in the  care plan section) Acute Rehab OT Goals Patient Stated Goal: Home today.  OT Frequency:     Barriers to D/C:            Co-evaluation              End of Session    Activity Tolerance: Patient tolerated treatment well Patient left: in bed;with call bell/phone within reach;with family/visitor present   Time: 0350-0938 OT Time Calculation (min): 22 min Charges:  OT General Charges $OT Visit: 1 Procedure OT Evaluation $Initial OT Evaluation Tier I: 1 Procedure OT Treatments $Self Care/Home Management : 8-22 mins G-Codes:    Haze Boyden Noelle Sease 08-Jul-2013, 9:23 AM [phone]

## 2013-06-21 NOTE — Anesthesia Postprocedure Evaluation (Signed)
Anesthesia Post Note  Patient: Kathryn Gay  Procedure(s) Performed: Procedure(s) (LRB): T12 KYPHOPLASTY (N/A)  Anesthesia type: General  Patient location: PACU  Post pain: Pain level controlled  Post assessment: Patient's Cardiovascular Status Stable  Last Vitals:  Filed Vitals:   06/21/13 0400  BP: 131/78  Pulse: 60  Temp: 36.4 C  Resp: 20    Post vital signs: Reviewed and stable  Level of consciousness: alert  Complications: No apparent anesthesia complications

## 2013-06-22 ENCOUNTER — Encounter (HOSPITAL_COMMUNITY): Payer: Self-pay | Admitting: Neurological Surgery

## 2013-06-22 NOTE — Progress Notes (Signed)
Utilization review completed.  

## 2013-08-09 NOTE — Discharge Summary (Signed)
Physician Discharge Summary  Patient ID: Kathryn Gay MRN: [id number] DOB/AGE: November 11, 1933 80 y.o.  Admit date: 06/20/2013 Discharge date: 08/09/2013  Admission Diagnoses:T`12 pathologic fracture  Discharge Diagnoses:T12 Pathologic fracture. Osteoporosis  Active Problems:   S/P kyphoplasty   Discharged Condition: good  Hospital Course: procedure only. Tolerated well  Consults: None  Significant Diagnostic Studies: non3  Treatments: surgery: Acrylic balloon kyphoplasty T12  Discharge Exam: Blood pressure 101/67, pulse 62, temperature 98 F (36.7 C), temperature source Oral, resp. rate 18, height 5' 5.5" (1.664 m), weight 72.576 kg (160 lb), SpO2 97.00%. normal motor and neural fuinction  Disposition: 01-Home or Self Care     Medication List    ASK your doctor about these medications       CALCIUM PO  Take 1 tablet by mouth daily.     chlorhexidine 0.12 % solution  Commonly known as:  PERIDEX  Use as directed 5 mLs in the mouth or throat 2 (two) times daily.     doxazosin 2 MG tablet  Commonly known as:  CARDURA  Take 2 mg by mouth every evening.     gabapentin 300 MG capsule  Commonly known as:  NEURONTIN  Take 300 mg by mouth at bedtime.     metoprolol tartrate 25 MG tablet  Commonly known as:  LOPRESSOR  Take 25 mg by mouth 2 (two) times daily.     temazepam 15 MG capsule  Commonly known as:  RESTORIL  Take 15 mg by mouth at bedtime.     vitamin C 500 MG tablet  Commonly known as:  ASCORBIC ACID  Take 500 mg by mouth daily.     VITAMIN D-3 PO  Take 1 capsule by mouth daily.         SignedEarleen Newport 08/09/2013, 8:56 AM

## 2013-11-14 DIAGNOSIS — M4804 Spinal stenosis, thoracic region: Secondary | ICD-10-CM | POA: Insufficient documentation

## 2013-11-14 DIAGNOSIS — I10 Essential (primary) hypertension: Secondary | ICD-10-CM | POA: Insufficient documentation

## 2015-04-26 ENCOUNTER — Other Ambulatory Visit: Payer: Self-pay | Admitting: Neurological Surgery

## 2015-04-26 DIAGNOSIS — M5412 Radiculopathy, cervical region: Secondary | ICD-10-CM

## 2015-06-19 ENCOUNTER — Encounter: Payer: Self-pay | Admitting: *Deleted

## 2015-06-20 NOTE — Discharge Instructions (Signed)
INSTRUCTIONS FOLLOWING OCULOPLASTIC SURGERY °AMY M. FOWLER, MD ° °AFTER YOUR EYE SURGERY, THER ARE MANY THINGS THWIHC YOU, THE PATIENT, CAN DO TO ASSURE THE BEST POSSIBLE RESULT FROM YOUR OPERATION.  THIS SHEET SHOULD BE REFERRED TO WHENEVER QUESTIONS ARISE.  IF THERE ARE ANY QUESTIONS NOT ANSWERED HERE, DO NOT HESITATE TO CALL OUR OFFICE AT [phone] OR [phone].  THERE IS ALWAYS [facility] AVAILABLE TO CALL IF QUESTIONS OR PROBLEMS ARISE. ° °VISION: Your vision may be blurred and out of focus after surgery until you are able to stop using your ointment, swelling resolves and your eye(s) heal. This may take 1 to 2 weeks at the least.  If your vision becomes gradually more dim or dark, this is not normal and you need to call our office immediately. ° °EYE CARE: For the first 48 hours after surgery, use ice packs frequently - “20 minutes on, 20 minutes off” - to help reduce swelling and bruising.  Small bags of frozen peas or corn make good ice packs along with cloths soaked in ice water.  If you are wearing a patch or other type of dressing following surgery, keep this on for the amount of time specified by your doctor.  For the first week following surgery, you will need to treat your stitches with great care.  If is OK to shower, but take care to not allow soapy water to run into your eye(s) to help reduce changes of infection.  You may gently clean the eyelashes and around the eye(s) with cotton balls and sterile water, BUT DO NOT RUB THE STITCHES VIGOROUSLY.  Keeping your stitches moist with ointment will help promote healing with minimal scar formation. ° °ACTIVITY: When you leave the surgery center, you should go home, rest and be inactive.  The eye(s) may feel scratchy and keeping the eyes closed will allow for faster healing.  The first week following surgery, avoid straining (anything making the face turn red) or lifting over 20 pounds.  Additionally, avoid bending which causes your head to go below  your waist.  Using your eyes will NOT harm them, so feel free to read, watch television, use the computer, etc as desired.  Driving depends on each individual, so check with your doctor if you have questions about driving. ° °MEDICATIONS:  You will be given a prescription for an ointment to use 4 times a day on your stitches.  You can use the ointment in your eyes if they feel scratchy or irritated.  If you eyelid(s) don’t close completely when you sleep, put some ointment in your eyes before bedtime. ° °EMERGENCY: If you experience SEVERE EYE PAIN OR HEADACHE UNRELIEVED BY TYLENOL OR PERCOCET, NAUSEA OR VOMITING, WORSENING REDNESS, OR WORSENING VISION (ESPECIALLY VISION THAT WA INITIALLY BETTER) CALL [phone] OR [phone] DURING BUSINESS HOURS OR AFTER HOURS. ° °General Anesthesia, Adult, Care After °Refer to this sheet in the next few weeks. These instructions provide you with information on caring for yourself after your procedure. Your health care provider may also give you more specific instructions. Your treatment has been planned according to current medical practices, but problems sometimes occur. Call your health care provider if you have any problems or questions after your procedure. °WHAT TO EXPECT AFTER THE PROCEDURE °After the procedure, it is typical to experience: °· Sleepiness. °· Nausea and vomiting. °HOME CARE INSTRUCTIONS °· For the first 24 hours after general anesthesia: °¨ Have a responsible person with you. °¨ Do not drive a car. If you   are alone, do not take public transportation. °¨ Do not drink alcohol. °¨ Do not take medicine that has not been prescribed by your health care provider. °¨ Do not sign important papers or make important decisions. °¨ You may resume a normal diet and activities as directed by your health care provider. °· Change bandages (dressings) as directed. °· If you have questions or problems that seem related to general anesthesia, call the hospital and ask for  the anesthetist or anesthesiologist on call. °SEEK MEDICAL CARE IF: °· You have nausea and vomiting that continue the day after anesthesia. °· You develop a rash. °SEEK IMMEDIATE MEDICAL CARE IF:  °· You have difficulty breathing. °· You have chest pain. °· You have any allergic problems. °  °This information is not intended to replace advice given to you by your health care provider. Make sure you discuss any questions you have with your health care provider. °  °Document Released: 05/04/2000 Document Revised: 02/16/2014 Document Reviewed: 05/27/2011 °Elsevier Interactive Patient Education ©2016 [employer] ° °

## 2015-06-25 ENCOUNTER — Ambulatory Visit: Payer: Medicare Other | Admitting: Anesthesiology

## 2015-06-25 ENCOUNTER — Ambulatory Visit
Admission: RE | Admit: 2015-06-25 | Discharge: 2015-06-25 | Disposition: A | Discharge status: Home / Self Care | Payer: Medicare Other | Source: Ambulatory Visit | Attending: Ophthalmology | Admitting: Ophthalmology

## 2015-06-25 ENCOUNTER — Encounter
Admission: RE | Disposition: A | Discharge status: Home / Self Care | Payer: Self-pay | Source: Ambulatory Visit | Attending: Ophthalmology

## 2015-06-25 DIAGNOSIS — I10 Essential (primary) hypertension: Secondary | ICD-10-CM | POA: Diagnosis not present

## 2015-06-25 DIAGNOSIS — Z79899 Other long term (current) drug therapy: Secondary | ICD-10-CM | POA: Insufficient documentation

## 2015-06-25 DIAGNOSIS — Z90710 Acquired absence of both cervix and uterus: Secondary | ICD-10-CM | POA: Insufficient documentation

## 2015-06-25 DIAGNOSIS — Z87891 Personal history of nicotine dependence: Secondary | ICD-10-CM | POA: Diagnosis not present

## 2015-06-25 DIAGNOSIS — H02403 Unspecified ptosis of bilateral eyelids: Secondary | ICD-10-CM | POA: Insufficient documentation

## 2015-06-25 DIAGNOSIS — Z9889 Other specified postprocedural states: Secondary | ICD-10-CM | POA: Diagnosis not present

## 2015-06-25 DIAGNOSIS — H02834 Dermatochalasis of left upper eyelid: Secondary | ICD-10-CM | POA: Diagnosis present

## 2015-06-25 DIAGNOSIS — H02831 Dermatochalasis of right upper eyelid: Secondary | ICD-10-CM | POA: Diagnosis present

## 2015-06-25 DIAGNOSIS — Z85828 Personal history of other malignant neoplasm of skin: Secondary | ICD-10-CM | POA: Diagnosis not present

## 2015-06-25 DIAGNOSIS — Z791 Long term (current) use of non-steroidal anti-inflammatories (NSAID): Secondary | ICD-10-CM | POA: Diagnosis not present

## 2015-06-25 DIAGNOSIS — Z9109 Other allergy status, other than to drugs and biological substances: Secondary | ICD-10-CM | POA: Insufficient documentation

## 2015-06-25 HISTORY — DX: Gastro-esophageal reflux disease without esophagitis: K21.9

## 2015-06-25 HISTORY — DX: Reserved for inherently not codable concepts without codable children: IMO0001

## 2015-06-25 HISTORY — DX: Presence of external hearing-aid: Z97.4

## 2015-06-25 HISTORY — PX: BROW LIFT: SHX178

## 2015-06-25 HISTORY — PX: PTOSIS REPAIR: SHX6568

## 2015-06-25 SURGERY — BLEPHAROPLASTY
Anesthesia: Monitor Anesthesia Care | Site: Eye | Laterality: Bilateral | Wound class: Clean

## 2015-06-25 MED ORDER — LACTATED RINGERS IV SOLN
INTRAVENOUS | Status: DC
  Administered 2015-06-25 (×2): via INTRAVENOUS

## 2015-06-25 MED ORDER — TETRACAINE HCL 0.5 % OP SOLN
OPHTHALMIC | Status: DC | PRN
  Administered 2015-06-25: 3 [drp] via OPHTHALMIC

## 2015-06-25 MED ORDER — OXYCODONE-ACETAMINOPHEN 5-325 MG PO TABS: 1.0000 | ORAL_TABLET | ORAL | Status: DC | PRN

## 2015-06-25 MED ORDER — NEOMYCIN-POLYMYXIN-DEXAMETH 3.5-10000-0.1 OP OINT
TOPICAL_OINTMENT | OPHTHALMIC | Status: DC | PRN
  Administered 2015-06-25: 1 via OPHTHALMIC

## 2015-06-25 MED ORDER — BSS IO SOLN
INTRAOCULAR | Status: DC | PRN
  Administered 2015-06-25: 15 mL via INTRAOCULAR

## 2015-06-25 MED ORDER — LIDOCAINE-EPINEPHRINE 2 %-1:100000 IJ SOLN
INTRAMUSCULAR | Status: DC | PRN
  Administered 2015-06-25: 8.5 mL via OPHTHALMIC

## 2015-06-25 MED ORDER — ERYTHROMYCIN 5 MG/GM OP OINT: TOPICAL_OINTMENT | OPHTHALMIC | Status: DC

## 2015-06-25 MED ORDER — ALFENTANIL 500 MCG/ML IJ INJ
INJECTION | INTRAMUSCULAR | Status: DC | PRN
  Administered 2015-06-25: 600 ug via INTRAVENOUS

## 2015-06-25 SURGICAL SUPPLY — 36 items
APPLICATOR COTTON TIP WD 3 STR (MISCELLANEOUS) ×6 IMPLANT
BLADE SURG 15 STRL LF DISP TIS (BLADE) ×2 IMPLANT
BLADE SURG 15 STRL SS (BLADE) ×4
CORD BIP STRL DISP 12FT (MISCELLANEOUS) ×3 IMPLANT
DRAPE HEAD BAR (DRAPES) ×3 IMPLANT
GAUZE SPONGE 4X4 12PLY STRL (GAUZE/BANDAGES/DRESSINGS) ×3 IMPLANT
GAUZE SPONGE NON-WVN 2X2 STRL (MISCELLANEOUS) ×10 IMPLANT
GLOVE SURG LX 7.0 MICRO (GLOVE) ×4
GLOVE SURG LX STRL 7.0 MICRO (GLOVE) ×2 IMPLANT
MARKER SKIN XFINE TIP W/RULER (MISCELLANEOUS) ×3 IMPLANT
NEEDLE FILTER BLUNT 18X 1/2SAF (NEEDLE) ×2
NEEDLE FILTER BLUNT 18X1 1/2 (NEEDLE) ×1 IMPLANT
NEEDLE HYPO 30X.5 LL (NEEDLE) ×6 IMPLANT
PACK DRAPE NASAL/ENT (PACKS) ×3 IMPLANT
SOL PREP PVP 2OZ (MISCELLANEOUS) ×3
SOLUTION PREP PVP 2OZ (MISCELLANEOUS) ×1 IMPLANT
SPONGE VERSALON 2X2 STRL (MISCELLANEOUS) ×20
SUT CHROMIC 4-0 (SUTURE) ×2
SUT CHROMIC 4-0 M2 12X2 ARM (SUTURE) ×1
SUT CHROMIC 5 0 P 3 (SUTURE) IMPLANT
SUT ETHILON 4 0 CL P 3 (SUTURE) IMPLANT
SUT MERSILENE 4-0 S-2 (SUTURE) ×6 IMPLANT
SUT PDS AB 4-0 P3 18 (SUTURE) IMPLANT
SUT PLAIN GUT (SUTURE) ×3 IMPLANT
SUT PROLENE 5 0 P 3 (SUTURE) ×3 IMPLANT
SUT PROLENE 6 0 P 1 18 (SUTURE) IMPLANT
SUT SILK 4 0 G 3 (SUTURE) IMPLANT
SUT VIC AB 5-0 P-3 18X BRD (SUTURE) IMPLANT
SUT VIC AB 5-0 P3 18 (SUTURE)
SUT VICRYL 6-0  S14 CTD (SUTURE)
SUT VICRYL 6-0 S14 CTD (SUTURE) IMPLANT
SUT VICRYL 7 0 TG140 8 (SUTURE) IMPLANT
SUTURE CHRMC 4-0 M2 12X2 ARM (SUTURE) ×1 IMPLANT
SYR 3ML LL SCALE MARK (SYRINGE) ×3 IMPLANT
SYRINGE 10CC LL (SYRINGE) ×3 IMPLANT
WATER STERILE IRR 500ML POUR (IV SOLUTION) ×3 IMPLANT

## 2015-06-25 NOTE — Interval H&P Note (Signed)
History and Physical Interval Note:  06/25/2015 8:21 AM  Kathryn Gay  has presented today for surgery, with the diagnosis of H02.403 PTOSIS OF BILATERAL EYELIDS L90.8 BROW PTOSIS H02.834 H02.831 DERMATOCHALASIS  The various methods of treatment have been discussed with the patient and family. After consideration of risks, benefits and other options for treatment, the patient has consented to  Procedure(s): BLEPHAROPLASTY (Bilateral) BROW PTOSIS (Bilateral) PTOSIS REPAIR (Bilateral) as a surgical intervention .  The patient's history has been reviewed, patient examined, no change in status, stable for surgery.  I have reviewed the patient's chart and labs.  Questions were answered to the patient's satisfaction.     Vickki Muff, Makinsey Pepitone M

## 2015-06-25 NOTE — Anesthesia Procedure Notes (Signed)
Procedure Name: MAC Performed by: Jalisa Sacco Pre-anesthesia Checklist: Patient identified, Emergency Drugs available, Suction available, Timeout performed and Patient being monitored Patient Re-evaluated:Patient Re-evaluated prior to inductionOxygen Delivery Method: Nasal cannula Placement Confirmation: positive ETCO2     

## 2015-06-25 NOTE — H&P (Signed)
  See history and physical completed at [facility] on 06/25/2015 and scanned into the chart.

## 2015-06-25 NOTE — Transfer of Care (Signed)
Immediate Anesthesia Transfer of Care Note  Patient: Kathryn Gay  Procedure(s) Performed: Procedure(s): BLEPHAROPLASTY BILATERAL EYES UPPER LIDS (Bilateral) BROW PTOSIS BILATERAL (Bilateral) PTOSIS REPAIR BILATERAL EYES (Bilateral)  Patient Location: PACU  Anesthesia Type: MAC  Level of Consciousness: awake, alert  and patient cooperative  Airway and Oxygen Therapy: Patient Spontanous Breathing and Patient connected to supplemental oxygen  Post-op Assessment: Post-op Vital signs reviewed, Patient's Cardiovascular Status Stable, Respiratory Function Stable, Patent Airway and No signs of Nausea or vomiting  Post-op Vital Signs: Reviewed and stable  Complications: No apparent anesthesia complications

## 2015-06-25 NOTE — Anesthesia Preprocedure Evaluation (Signed)
Anesthesia Evaluation  Patient identified by MRN, date of birth, ID band Patient awake    History of Anesthesia Complications (+) PONV and history of anesthetic complications  Airway Mallampati: I  TM Distance: >3 FB Neck ROM: Full    Dental no notable dental hx.    Pulmonary shortness of breath, former smoker,    Pulmonary exam normal        Cardiovascular hypertension, Pt. on medications and Pt. on home beta blockers Normal cardiovascular exam     Neuro/Psych  Headaches,    GI/Hepatic Neg liver ROS, GERD  ,  Endo/Other  negative endocrine ROS  Renal/GU negative Renal ROS  negative genitourinary   Musculoskeletal  (+) Arthritis , Osteoarthritis,    Abdominal   Peds  Hematology negative hematology ROS (+)   Anesthesia Other Findings   Reproductive/Obstetrics                             Anesthesia Physical Anesthesia Plan  ASA: II  Anesthesia Plan: MAC   Post-op Pain Management:    Induction: Intravenous  Airway Management Planned:   Additional Equipment:   Intra-op Plan:   Post-operative Plan:   Informed Consent: I have reviewed the patients History and Physical, chart, labs and discussed the procedure including the risks, benefits and alternatives for the proposed anesthesia with the patient or authorized representative who has indicated his/her understanding and acceptance.     Plan Discussed with: CRNA  Anesthesia Plan Comments:         Anesthesia Quick Evaluation

## 2015-06-25 NOTE — Op Note (Signed)
Preoperative Diagnosis:   1.  Visually significant bilateral brow ptosis.  2.  Visually significant blepharoptosis bilateral Upper Eyelid(s) 3.  Visually significant dermatochalasis bilateral Upper Eyelid(s)  Postoperative Diagnosis: Same.   Procedure(s) Performed:   1. Bilateral Direct brow lift to improve vision.  2.  Blepharoptosis repair with levator aponeurosis advancement bilateral Upper Eyelid(s) 3.  Upper eyelid blepharoplasty with excess skin excision  bilateral Upper Eyelid(s)  Teaching Surgeon: Philis Pique. Vickki Muff, M.D.   Assistants: None   Anesthesia: MAC  Specimens: None.  Estimated Blood Loss: Minimal.  Complications: None.  Operative Findings: None Dictated  PROCEDURE:   Allergies were reviewed and the patient has No Known Allergies..   After the risks, benefits, complications and alternatives were discussed with the patient, appropriate informed consent was obtained. While seated in an upright position and looking in primary gaze, the amount of supra-brow skin to be removed was measured and marked in an elliptical pattern. The patient was then brought to the operating suite and reclined supine.  Timeout was conducted and the patient was sedated. Local anesthetic consisting of a 50-50 mixture of 2% lidocaine with epinephrine and 0.75% bupivacaine with added Hylenex was injected subcutaneously to the bilateral brow region(s) and down to the periosteum and  subcutaneously to both upper eyelid(s). After adequate local was instilled, the patient was prepped and draped in the usual sterile fashion for eyelid surgery.   Attention was turned to the right brow region. A #15 blade was used to create a bevelled incision along the premarked incision line. A skin and subcutaneous tissue flap was then excised and hemostasis was obtained with bipolar cautery. The deep tissues were reapproximated with interrupted vertical 5-0 chromic sutures. The skin margin was reapproximated with a  running locking 5-0 Prolene suture. Attention was then turned to the opposite brow region where the same procedure was performed in the same manner.    Attention was then turned to the upper eyelids. A 29mm upper eyelid crease incision line was marked with calipers on both upper eyelid(s).  A pinch test was used to estimate the amount of excess skin to remove and this was marked in standard blepharoplasty style fashion. Attention was turned to the right upper eyelid. A #15 blade was used to open the premarked incision line. A skin only flap was excised and hemostasis was obtained with bipolar cautery.   Westcott scissors were then used to transect through orbicularis for the length of the incision down to the tarsal plate. Epitarsus was dissected to create a smooth surface to suture to. Dissection was then carried superiorly in the plane between orbicularis and orbital septum. Once the preaponeurotic fat pocket was identified, the orbital septum was opened. This revealed the levator and its aponeurosis.    Attention was then turned to the opposite eyelid where the same procedure was performed in the same manner.   3 interrupted 6-0 Prolene sutures were then passed partial thickness through the tarsal plates of both upper eyelid(s). These sutures were placed in line with the mid pupillary, medial limbal, and lateral limbal lines. The sutures were fixed to the levator aponeurosis and adjusted until a nice lid height and contour were achieved. Once nice symmetry was achieved, the skin incisions were closed with a combination of interrupted and  running 6-0 fast absorbing plain gut suture.   The patient tolerated the procedure well. Erythromycin ophthalmic ointment was applied to the incision site(s) followed by ice packs.The patient was taken to the recovery area where  she recovered without difficulty.  Post-Op Plan/Instructions:  The patient was instructed to use ice packs frequently for the next 48  hours. Marland Kitchenshe  was instructed to use erythromycin ophthalmic ointment on her incisions 4 times a day for the next 12 to 14 days. she was given a prescription for Percocet for pain control should Tylenol not be effective. she was asked to to follow up in 10 days time for suture removal or sooner as needed for problems.  Teaching Surgeon Attestation: None  Laron Angelini M. Vickki Muff, M.D. Attending,Ophthalmology

## 2015-06-25 NOTE — Anesthesia Postprocedure Evaluation (Signed)
Anesthesia Post Note  Patient: Kathryn Gay  Procedure(s) Performed: Procedure(s) (LRB): BLEPHAROPLASTY BILATERAL EYES UPPER LIDS (Bilateral) BROW PTOSIS BILATERAL (Bilateral) PTOSIS REPAIR BILATERAL EYES (Bilateral)  Patient location during evaluation: PACU Anesthesia Type: MAC Level of consciousness: awake and alert and oriented Pain management: pain level controlled Vital Signs Assessment: post-procedure vital signs reviewed and stable Respiratory status: spontaneous breathing and nonlabored ventilation Cardiovascular status: stable Postop Assessment: no signs of nausea or vomiting and adequate PO intake Anesthetic complications: no    Estill Batten

## 2015-06-26 ENCOUNTER — Encounter: Payer: Self-pay | Admitting: Ophthalmology

## 2015-08-22 ENCOUNTER — Other Ambulatory Visit: Payer: Self-pay | Admitting: Neurological Surgery

## 2015-08-22 DIAGNOSIS — M5416 Radiculopathy, lumbar region: Secondary | ICD-10-CM

## 2015-08-22 DIAGNOSIS — M25551 Pain in right hip: Secondary | ICD-10-CM

## 2015-09-10 ENCOUNTER — Ambulatory Visit
Admission: RE | Admit: 2015-09-10 | Discharge: 2015-09-10 | Disposition: A | Discharge status: Home / Self Care | Payer: Medicare Other | Source: Ambulatory Visit | Attending: Neurological Surgery | Admitting: Neurological Surgery

## 2015-09-10 DIAGNOSIS — M25551 Pain in right hip: Secondary | ICD-10-CM

## 2015-09-10 DIAGNOSIS — M5416 Radiculopathy, lumbar region: Secondary | ICD-10-CM

## 2015-09-10 MED ORDER — GADOBENATE DIMEGLUMINE 529 MG/ML IV SOLN
7.0000 mL | Freq: Once | INTRAVENOUS | Status: AC | PRN
  Administered 2015-09-10: 7 mL via INTRAVENOUS

## 2015-09-20 ENCOUNTER — Other Ambulatory Visit: Payer: Self-pay | Admitting: Neurological Surgery

## 2015-09-20 DIAGNOSIS — M25551 Pain in right hip: Secondary | ICD-10-CM

## 2015-10-02 ENCOUNTER — Ambulatory Visit
Admission: RE | Admit: 2015-10-02 | Discharge: 2015-10-02 | Disposition: A | Discharge status: Home / Self Care | Payer: Medicare Other | Source: Ambulatory Visit | Attending: Neurological Surgery | Admitting: Neurological Surgery

## 2015-10-02 DIAGNOSIS — M25551 Pain in right hip: Secondary | ICD-10-CM

## 2015-10-02 MED ORDER — IOPAMIDOL (ISOVUE-M 200) INJECTION 41%
1.0000 mL | Freq: Once | INTRAMUSCULAR | Status: AC
  Administered 2015-10-02: 1 mL via INTRA_ARTICULAR

## 2015-10-02 MED ORDER — METHYLPREDNISOLONE ACETATE 40 MG/ML INJ SUSP (RADIOLOG
120.0000 mg | Freq: Once | INTRAMUSCULAR | Status: AC
  Administered 2015-10-02: 120 mg via INTRA_ARTICULAR

## 2015-10-28 ENCOUNTER — Ambulatory Visit (INDEPENDENT_AMBULATORY_CARE_PROVIDER_SITE_OTHER): Payer: Medicare Other

## 2015-10-28 ENCOUNTER — Other Ambulatory Visit: Payer: Self-pay | Admitting: *Deleted

## 2015-10-28 ENCOUNTER — Encounter: Payer: Self-pay | Admitting: Podiatry

## 2015-10-28 ENCOUNTER — Ambulatory Visit (INDEPENDENT_AMBULATORY_CARE_PROVIDER_SITE_OTHER): Payer: Medicare Other | Admitting: Podiatry

## 2015-10-28 DIAGNOSIS — M898X9 Other specified disorders of bone, unspecified site: Secondary | ICD-10-CM

## 2015-10-28 DIAGNOSIS — L603 Nail dystrophy: Secondary | ICD-10-CM | POA: Diagnosis not present

## 2015-10-28 NOTE — Progress Notes (Signed)
   Subjective:    Patient ID: Kathryn Gay, female    DOB: 1933-05-20, 82 y.o.   MRN: [id number]  HPI: She presents today with a chief complaint of painful toenail hallux right. She states that when she pushes on the nail or puts pressure on it it bothers her. She denies any trauma to the nail. States that she has had matrixectomy performed in the past.    Review of Systems  HENT: Positive for hearing loss and tinnitus.   Musculoskeletal: Positive for arthralgias and back pain.  All other systems reviewed and are negative.      Objective:   Physical Exam: Vital signs are stable alert and oriented 3. Pulses are strongly palpable. Neurologic sensorium is intact deep tendon reflexes are intact and muscle strength is normal bilateral. Orthopedic evaluation jointsdistaltotheankleforrangeofmotionorcrepitation.Shehastendernessonpalpationofthehalluxnail plate right. Radiographs taken today do demonstrate a small subungual exostosis but I feel that the thickness of the nail is what causing her symptoms.        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  Plan: I encouraged her to smooth the nail and reduce the thickness and follow-up with me if this does not work.

## 2016-02-21 ENCOUNTER — Other Ambulatory Visit: Payer: Self-pay | Admitting: Internal Medicine

## 2016-02-21 DIAGNOSIS — Z1231 Encounter for screening mammogram for malignant neoplasm of breast: Secondary | ICD-10-CM

## 2016-03-24 ENCOUNTER — Ambulatory Visit
Admission: RE | Admit: 2016-03-24 | Discharge: 2016-03-24 | Disposition: A | Discharge status: Home / Self Care | Payer: Medicare Other | Source: Ambulatory Visit | Attending: Internal Medicine | Admitting: Internal Medicine

## 2016-03-24 DIAGNOSIS — Z1231 Encounter for screening mammogram for malignant neoplasm of breast: Secondary | ICD-10-CM | POA: Insufficient documentation

## 2016-03-26 ENCOUNTER — Ambulatory Visit: Payer: Medicare Other

## 2016-07-20 ENCOUNTER — Ambulatory Visit: Payer: Medicare Other | Admitting: Podiatry

## 2017-07-21 ENCOUNTER — Other Ambulatory Visit: Payer: Self-pay | Admitting: Internal Medicine

## 2017-07-21 DIAGNOSIS — Z1231 Encounter for screening mammogram for malignant neoplasm of breast: Secondary | ICD-10-CM

## 2017-07-27 ENCOUNTER — Ambulatory Visit
Admission: RE | Admit: 2017-07-27 | Discharge: 2017-07-27 | Disposition: A | Discharge status: Home / Self Care | Payer: Medicare Other | Source: Ambulatory Visit | Attending: Internal Medicine | Admitting: Internal Medicine

## 2017-07-27 DIAGNOSIS — Z1231 Encounter for screening mammogram for malignant neoplasm of breast: Secondary | ICD-10-CM | POA: Insufficient documentation

## 2017-09-09 DEATH — deceased

## 2018-01-11 IMAGING — MR MR LUMBAR SPINE WO/W CM
5 of 8 series · 25 of 48 positions shown · IV contrast (multihance)
Comparison: Plain films 04/25/2015.  MRI lumbar spine 05/20/2013.

CLINICAL DATA: Back pain for 1 year. No radicular symptoms are
reported.

EXAM:
MRI LUMBAR SPINE WITHOUT AND WITH CONTRAST
TECHNIQUE: Multiplanar and multiecho pulse sequences of the lumbar spine were
obtained without and with intravenous contrast.
CONTRAST:  7mL MULTIHANCE GADOBENATE DIMEGLUMINE 529 MG/ML IV SOLN

[Series 4: T1 · sagittal · 4.0mm · 0.55mm/px · 3 of 14 slices shown (1 of 2)]
[im 1/14]
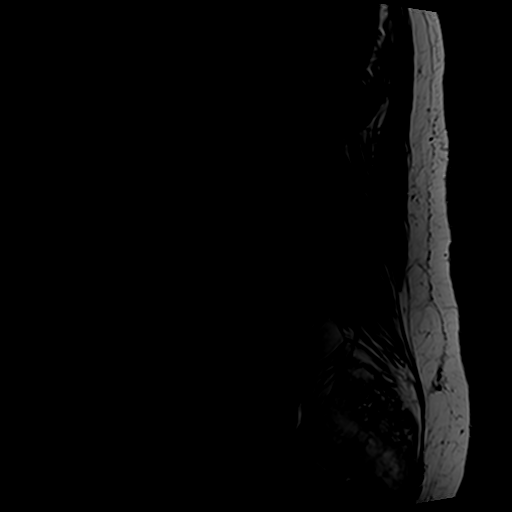
[im 7/14]
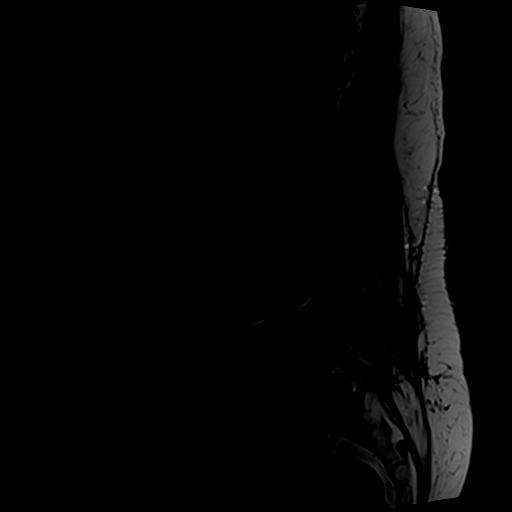
[im 14/14]
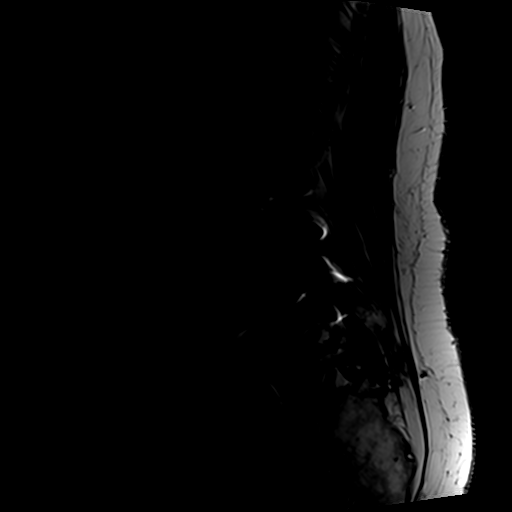

[Series 5: T2 post-contrast · sagittal · 4.0mm · 0.55mm/px · 4 of 14 slices shown]
[im 1/14]
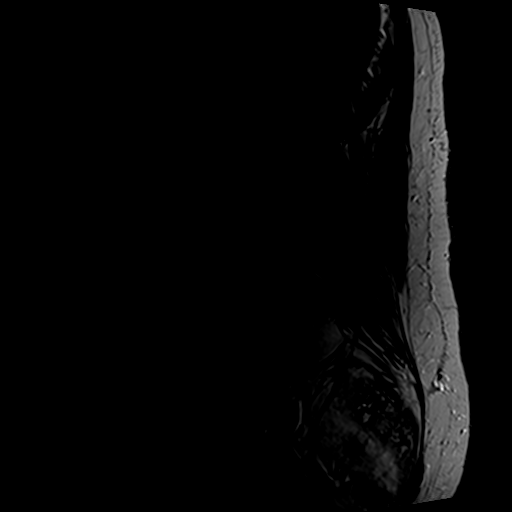
[im 5/14]
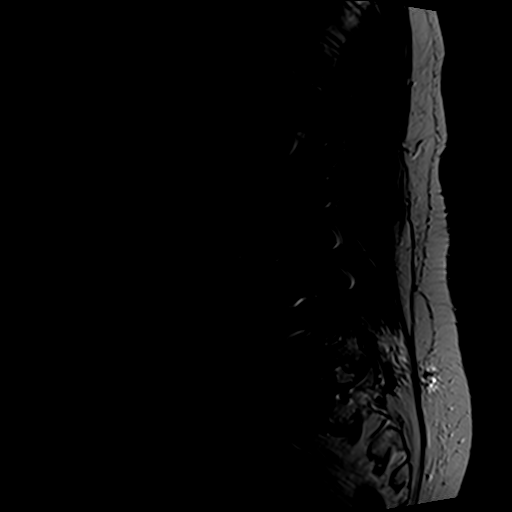
[im 9/14]
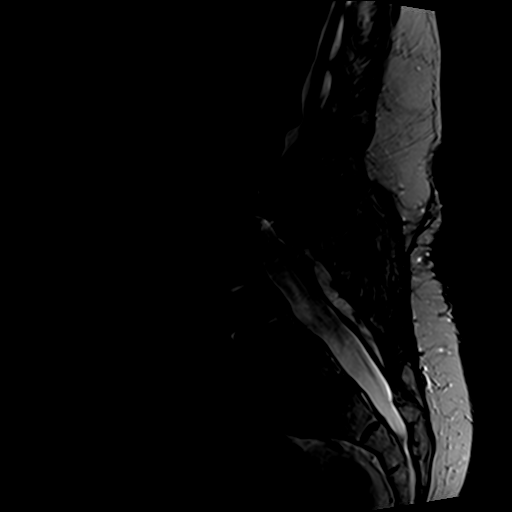
[im 14/14]
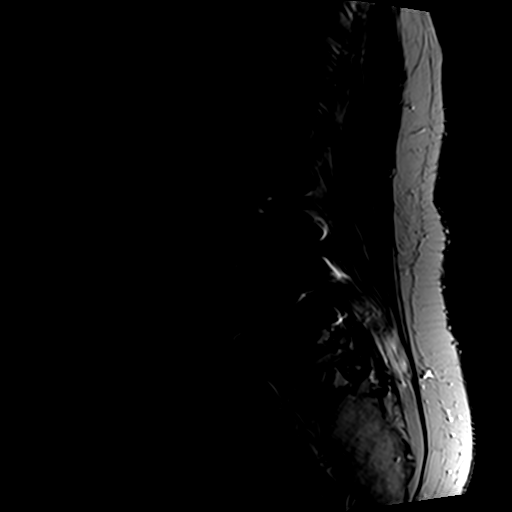

[Series 6: T2 · axial · 4.0mm · 0.70mm/px · z∈[-51,+149]mm · 8 of 39 slices shown]
[im 1/39]
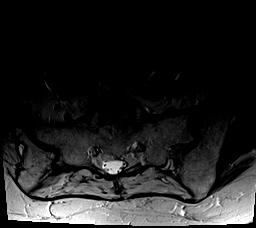
[im 5/39]
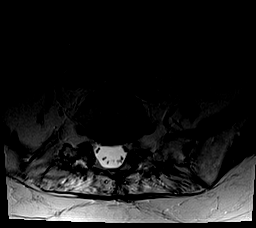
[im 13/39]
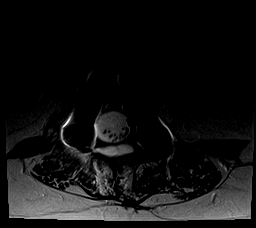
[im 17/39]
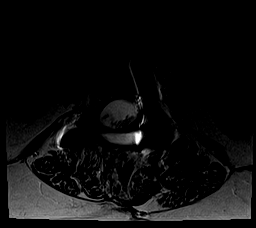
[im 22/39]
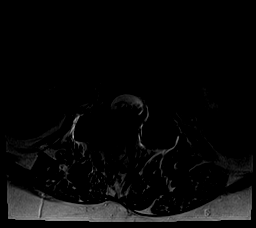
[im 26/39]
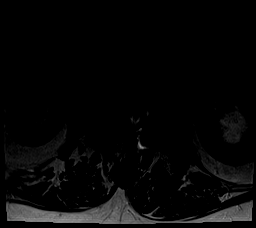
[im 34/39]
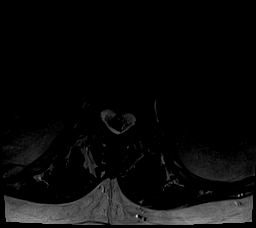
[im 39/39]
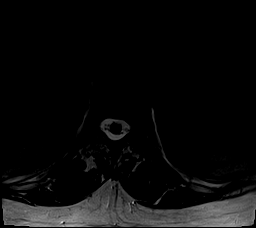

[Series 7: T1 · axial · 4.0mm · 0.35mm/px · z∈[-51,+149]mm · 8 of 39 slices shown (2 of 2)]
[im 1/39]
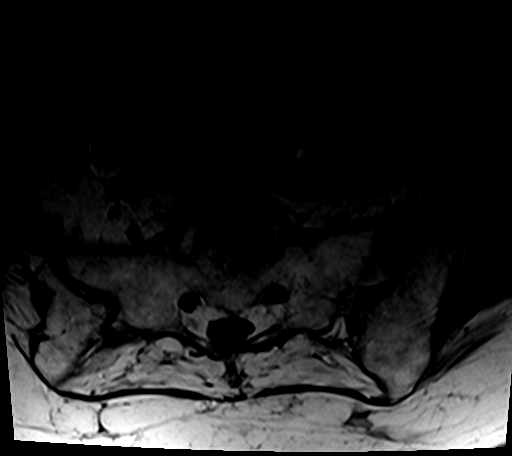
[im 5/39]
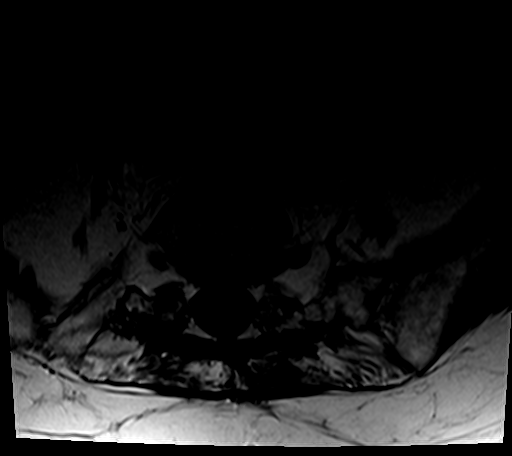
[im 13/39]
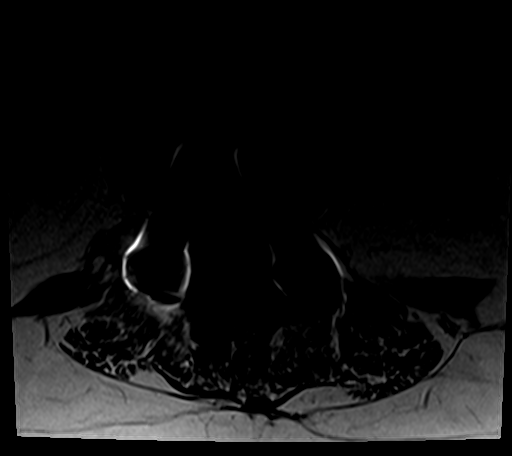
[im 17/39]
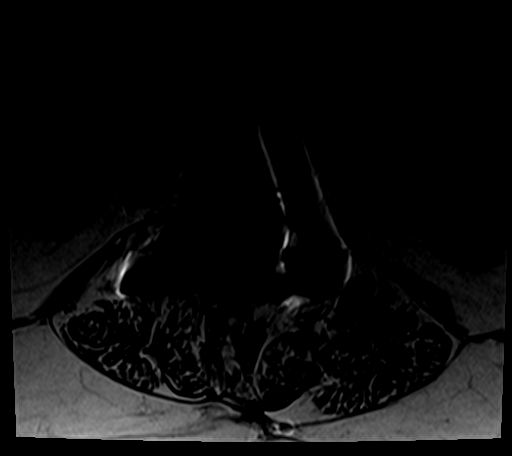
[im 22/39]
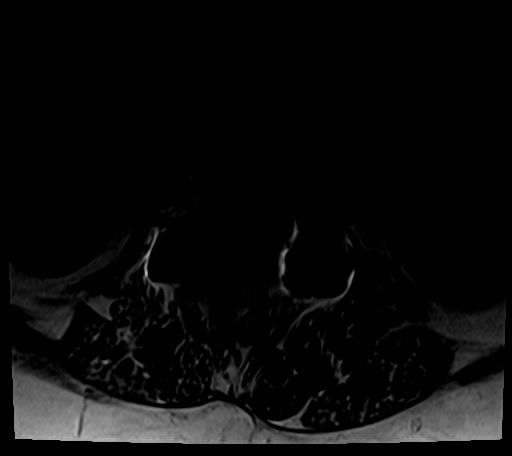
[im 26/39]
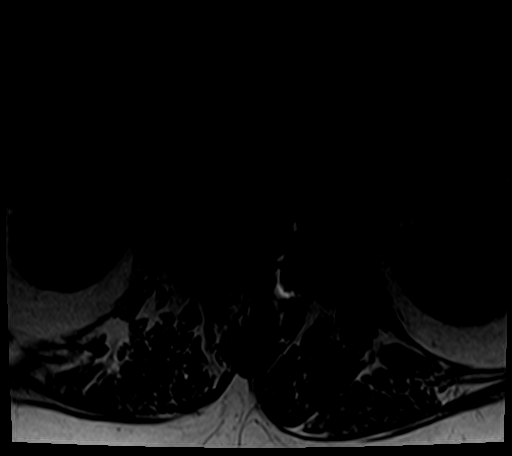
[im 34/39]
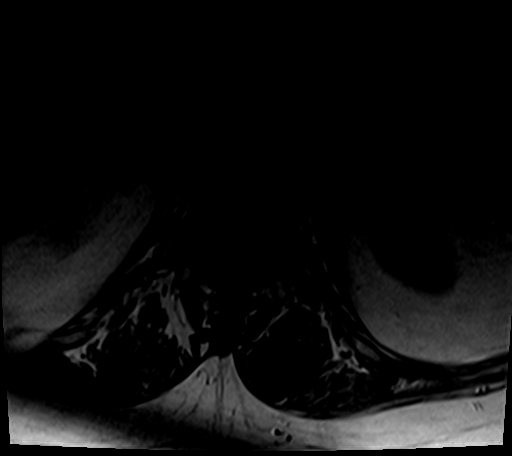
[im 39/39]
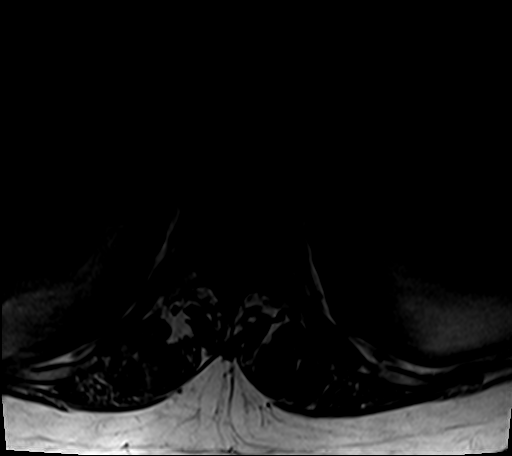

[Series 8: T1 fat-sat post-contrast · sagittal · 4.0mm · 0.55mm/px · 2 of 14 slices shown]
[im 1/14]
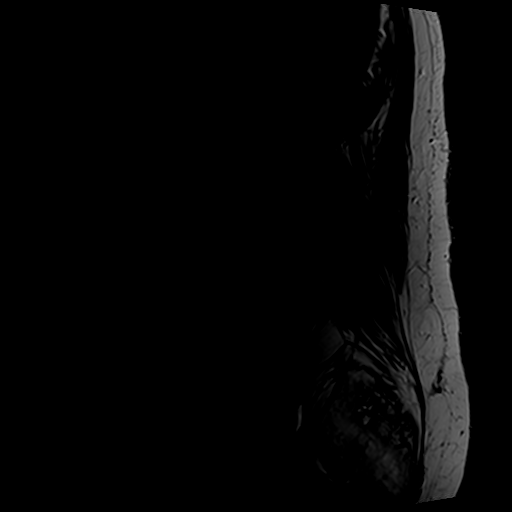
[im 5/14]
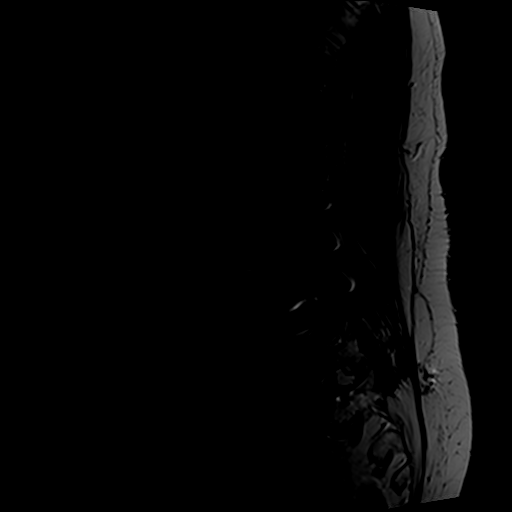

[25 of 48 positions shown; findings below may reference images not displayed]

FINDINGS: Segmentation:  Standard.

Alignment: Moderate 7 mm facet mediated anterolisthesis at L4-5,
stable from priors. There is exaggerated kyphosis at L1-2, the level
above the L2 through L4 pedicle screw construct.

Asymmetric loss of disc height at this level also results in
scoliotic deformity convex LEFT.

Vertebrae: Solid appearing L2-L4 fusion. Remote T12 kyphoplasty,
uncomplicated. No abnormal enhancement postcontrast

Conus medullaris: Extends to the L1 level and appears normal.

Paraspinal and other soft tissues: Renal cystic disease, LEFT upper
pole, appears stable from priors.

Disc levels:

L1-L2: Central protrusion with posterior element hypertrophy
representing adjacent segment disease. Severe stenosis. Asymmetric
loss of interspace height on the RIGHT. RIGHT greater than LEFT L1
and L2 nerve root impingement.

L2-L3:  Unremarkable.

L3-L4:  Unremarkable.

L4-L5:  Wide posterior decompression.  7 mm slip.  No impingement.

L5-S1:  2 mm anterolisthesis.  Annular bulge.  No impingement.

Compared with priors, the stenosis at L1-L2 appear similar
IMPRESSION: Multifactorial adjacent segment disease at L1-L2 related to central
and rightward protrusion, asymmetric loss of interspace height, and
posterior element hypertrophy severe stenosis. RIGHT greater than
LEFT L1 and L2 nerve root impingement are likely, similar to [id number].

## 2018-01-11 IMAGING — MR MR HIP*R* W/O CM
4 of 5 series · 16 of 40 positions shown · non-contrast
Comparison: None.

CLINICAL DATA: Right hip, leg and back pain for 1 year. No known
injury.

EXAM:
MR OF THE RIGHT HIP WITHOUT CONTRAST
TECHNIQUE: Multiplanar, multisequence MR imaging was performed. No intravenous
contrast was administered.

[Series 4: T1 · coronal · 4.0mm · 0.59mm/px · 3 of 19 slices shown (1 of 2)]
[im 4/19]
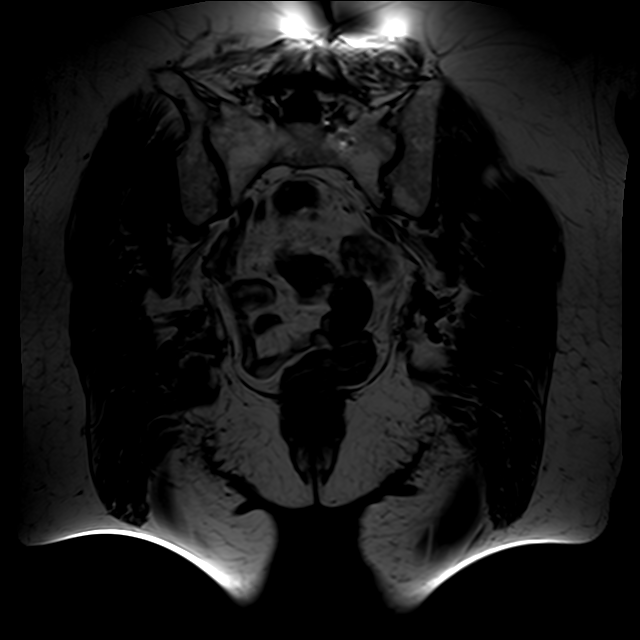
[im 10/19]
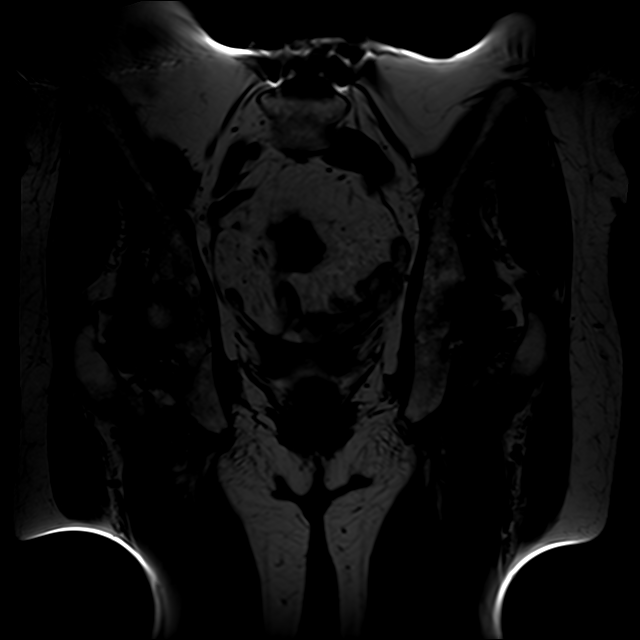
[im 16/19]
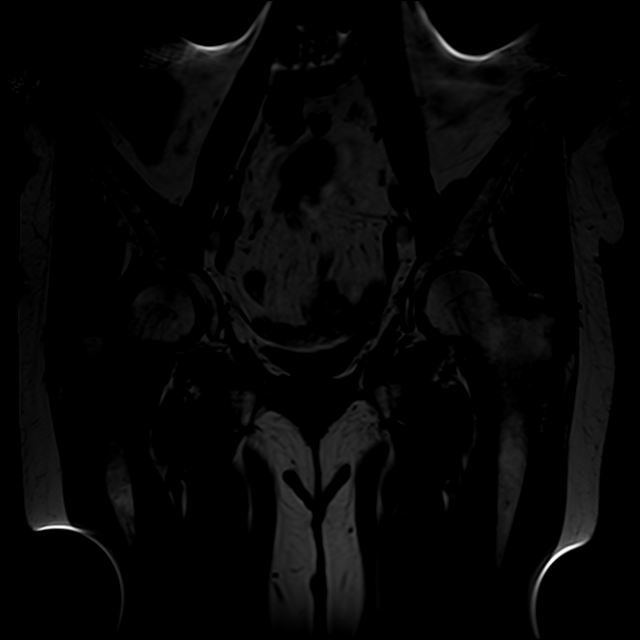

[Series 5: T2 fat-sat · axial · 4.0mm · 0.59mm/px · z∈[-63,+37]mm · 4 of 25 slices shown]
[im 1/25]
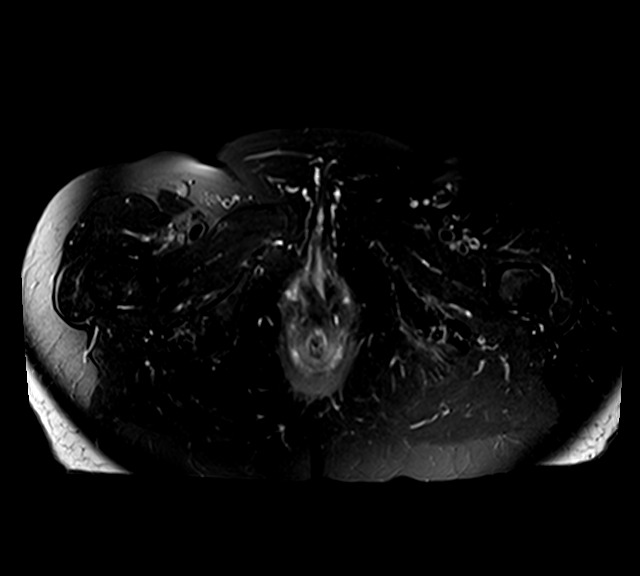
[im 3/25]
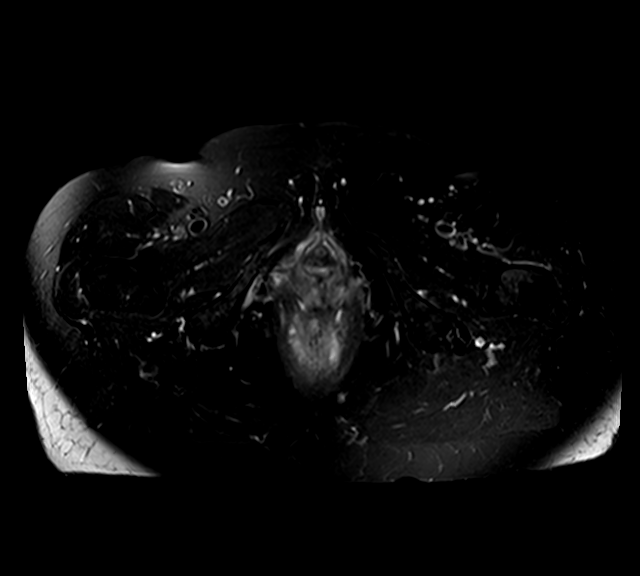
[im 14/25]
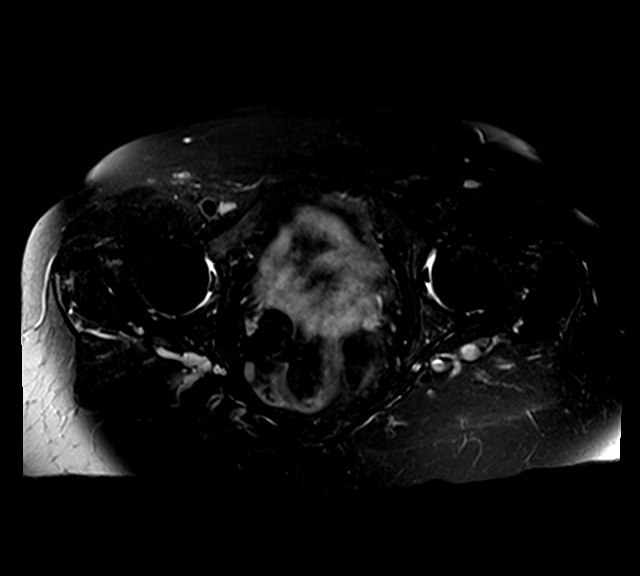
[im 22/25]
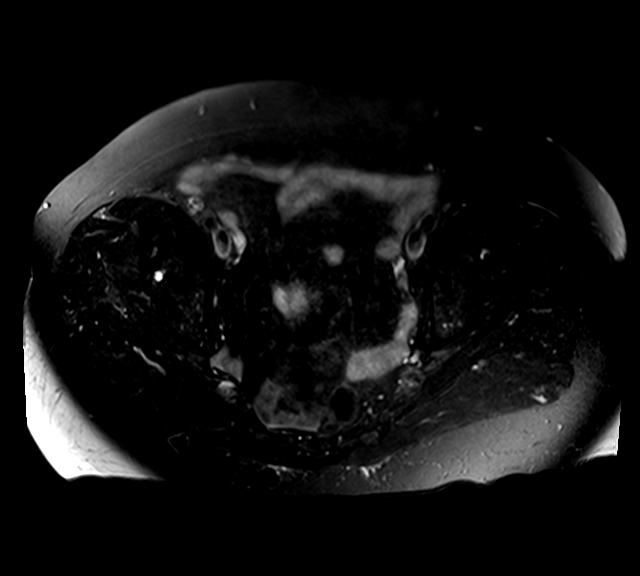

[Series 6: T1 · axial · 4.0mm · 0.59mm/px · z∈[-54,+37]mm · 3 of 25 slices shown (2 of 2)]
[im 3/25]
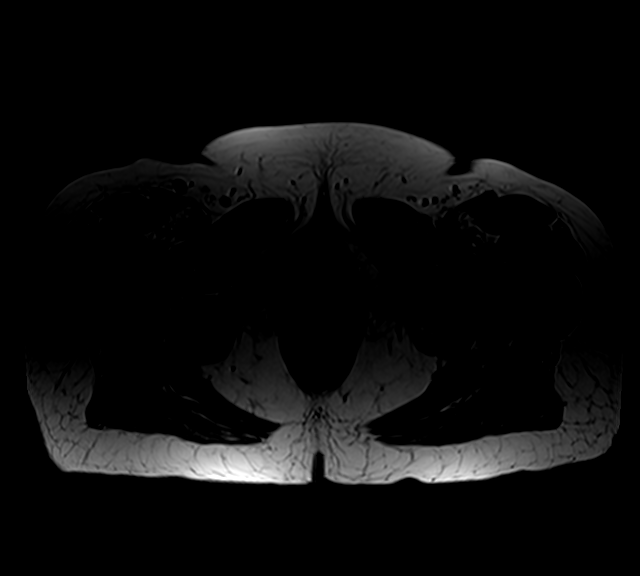
[im 14/25]
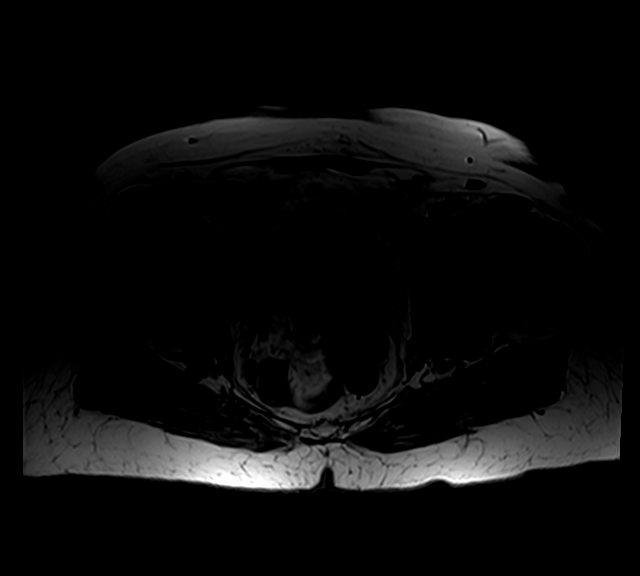
[im 22/25]
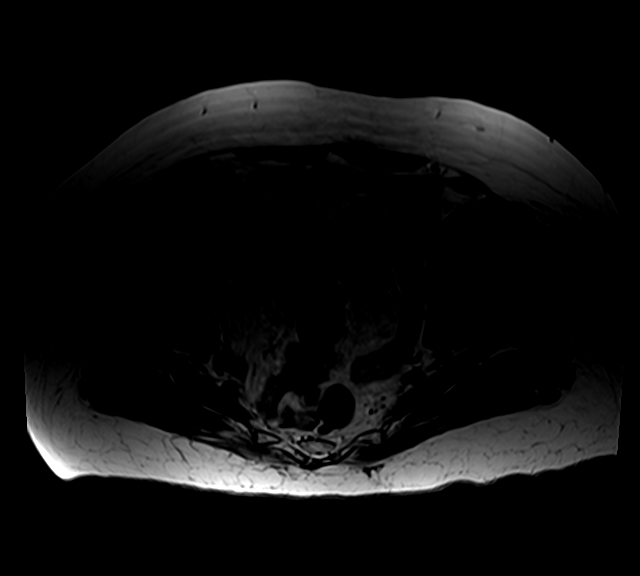

[Series 7: PD fat-sat · sagittal · 4.0mm · 0.33mm/px · 6 of 17 slices shown]
[im 1/17]
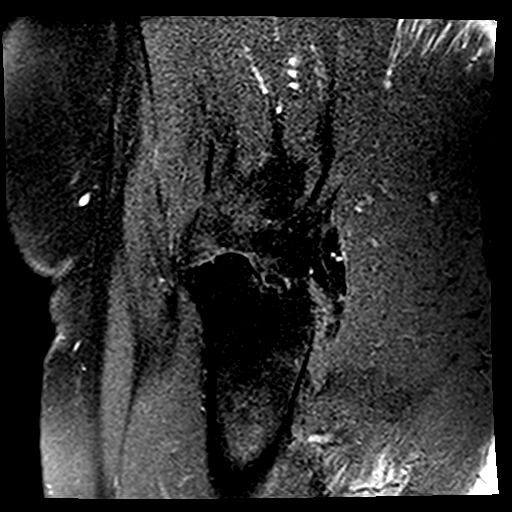
[im 4/17]
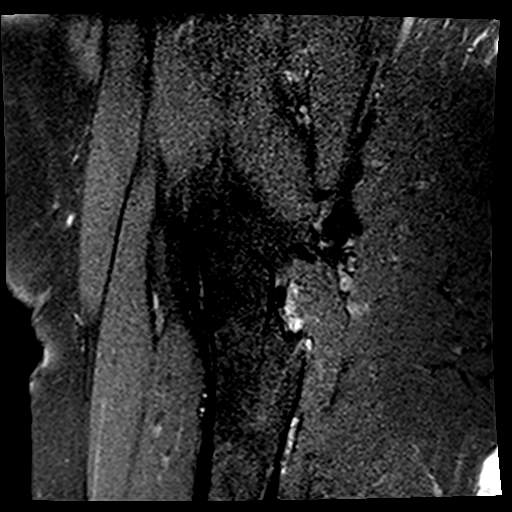
[im 7/17]
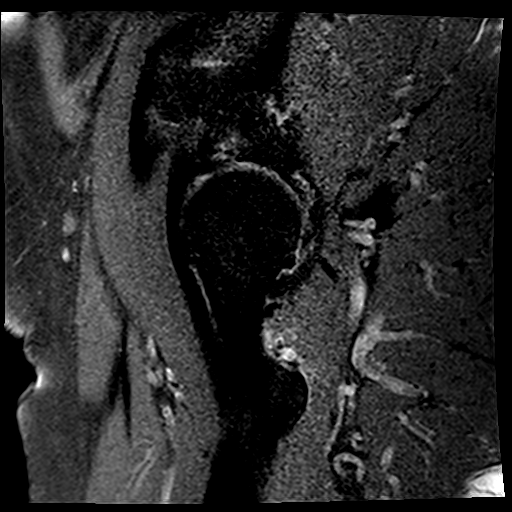
[im 10/17]
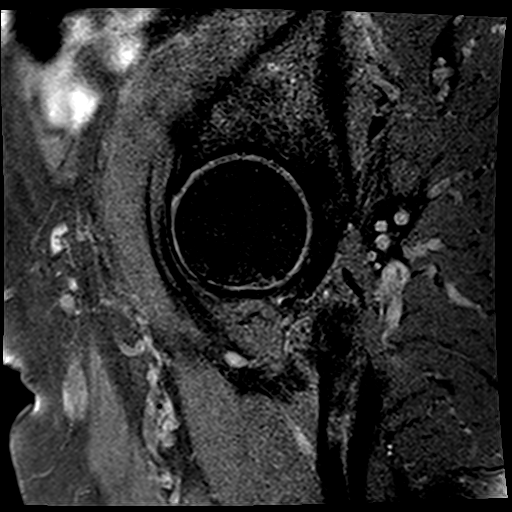
[im 13/17]
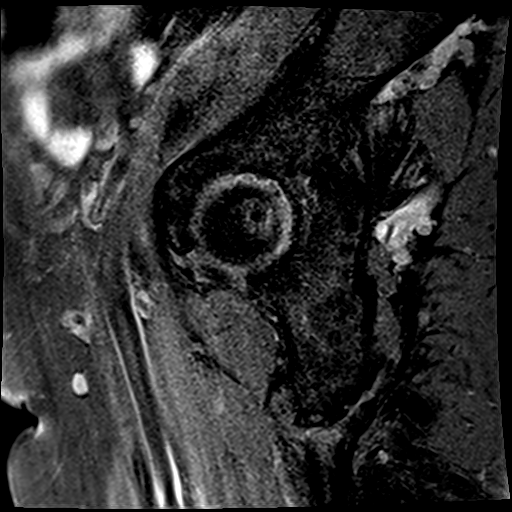
[im 17/17]
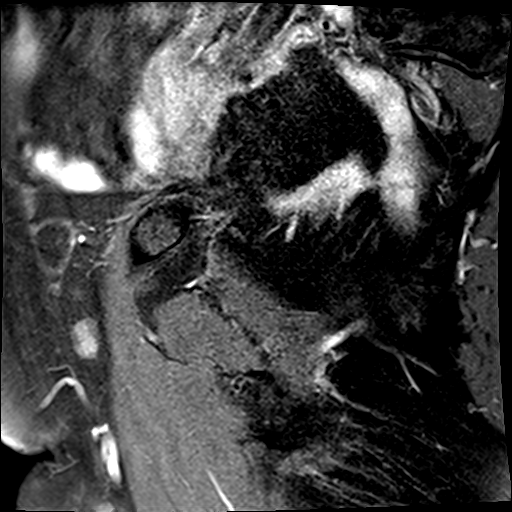

[16 of 40 positions shown; findings below may reference images not displayed]

FINDINGS: Bones: No fracture or worrisome marrow lesion. No avascular necrosis
of the femoral heads is present. Small subchondral cyst in the roof
of the right acetabulum is noted.

Articular cartilage and labrum

Articular cartilage:  Mildly degenerated bilaterally.

Labrum:  Intact.

Joint or bursal effusion

Joint effusion:  None.

Bursae:  Unremarkable.

Muscles and tendons

Muscles and tendons:  Intact.

Other findings

Miscellaneous: Imaged intrapelvic contents demonstrate postoperative
change of hysterectomy. There is partial visualization of lower
lumbar fusion.
IMPRESSION: Mild bilateral hip osteoarthritis appears more notable on the right.
The examination is otherwise unremarkable.

## 2018-02-02 IMAGING — XA DG FLUORO GUIDE NDL PLC/BX
1 series · 1 of 1 positions shown · IV contrast (isovue)
Comparison: Recent MRI

CLINICAL DATA: Right hip region pain. Question hip pathology versus
spine pathology.

EXAM:
Fluoroscopically guided right hip injection
TECHNIQUE: The overlying skin was prepped with Betadine, draped in the usual
sterile fashion, and infiltrated locally with 1% Lidocaine. A 22
gauge spinal needle was advanced under fluoroscopic observation to
the lateral femoral neck. Confirmatory injection of less than 1 ml
of Isovue 200 demonstrates intra-articular spread without
intravascular component.
120 mg Depo-Medrol and 2 cc 1% lidocaine were then instilled. The
procedure was well tolerated. The patient was observed for a short
period of time then discharged in good condition.
FLUOROSCOPY TIME:  0 minutes 21 seconds. 35.76 micro gray meter
squared

[Series 1: ortho standard · 1 of 1 slices shown]
[im 1/1]
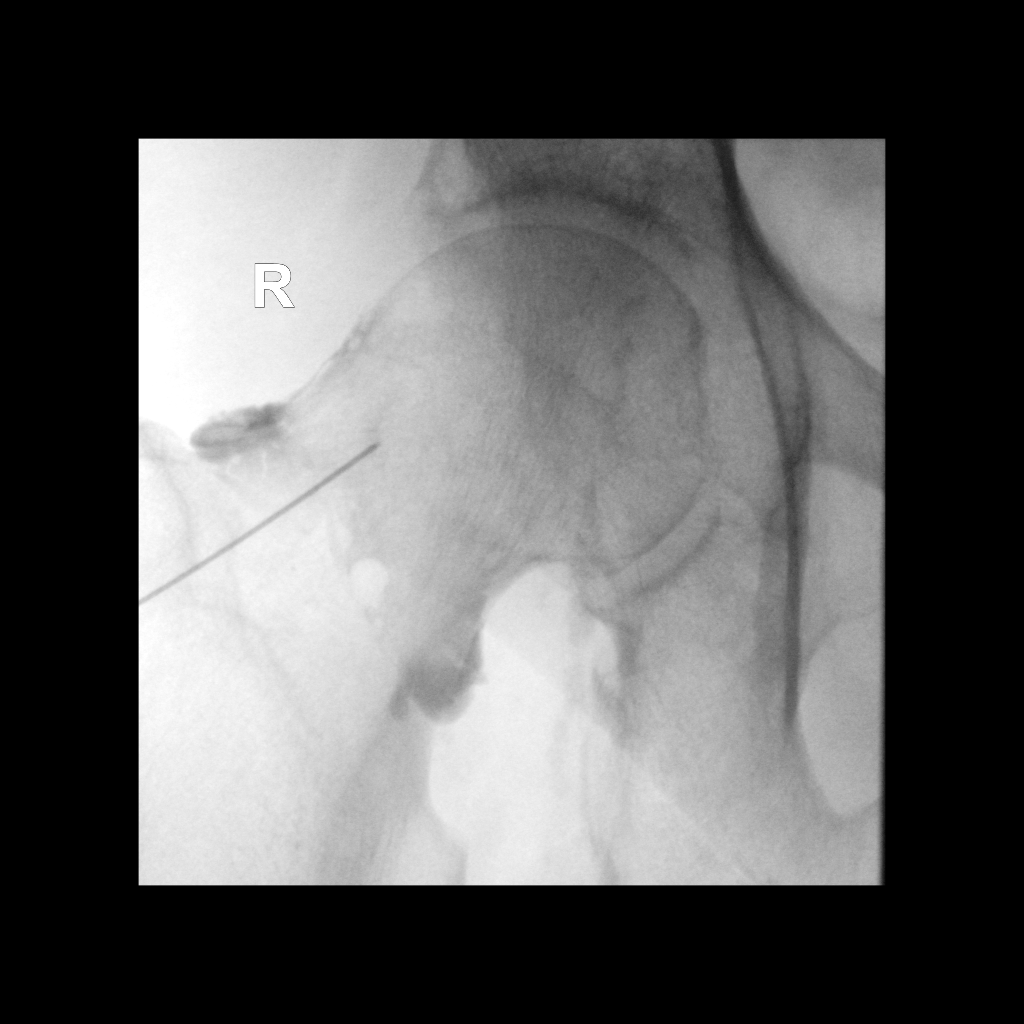

[1 of 1 positions shown; findings below may reference images not displayed]

IMPRESSION: Technically successful right hip injection under fluoroscopy. The
expected time courses of the anesthetic and steroid were discussed
with the patient and she is to follow-up with Dr. Mcgregor.
